# Patient Record
Sex: Female | Born: 1990 | Race: Black or African American | Hispanic: No | Marital: Single | Smoking: Never smoker
Health system: Southern US, Community
[De-identification: ages and names within clinical notes are randomized; demographics above are authoritative.]

## PROBLEM LIST (undated history)

## (undated) DIAGNOSIS — J45909 Unspecified asthma, uncomplicated: Secondary | ICD-10-CM

## (undated) HISTORY — PX: NO PAST SURGERIES: SHX2092

---

## 2017-02-08 ENCOUNTER — Ambulatory Visit (HOSPITAL_COMMUNITY)
Admission: RE | Admit: 2017-02-08 | Discharge: 2017-02-08 | Disposition: A | Discharge status: Home / Self Care | Payer: Self-pay | Source: Ambulatory Visit | Attending: Physician Assistant | Admitting: Physician Assistant

## 2017-02-08 ENCOUNTER — Ambulatory Visit (INDEPENDENT_AMBULATORY_CARE_PROVIDER_SITE_OTHER): Payer: Self-pay | Admitting: Physician Assistant

## 2017-02-08 ENCOUNTER — Encounter (INDEPENDENT_AMBULATORY_CARE_PROVIDER_SITE_OTHER): Payer: Self-pay | Admitting: Physician Assistant

## 2017-02-08 VITALS — BP 118/79 | HR 102 | Temp 97.9°F | Ht 66.0 in | Wt 253.0 lb

## 2017-02-08 DIAGNOSIS — M25571 Pain in right ankle and joints of right foot: Secondary | ICD-10-CM | POA: Insufficient documentation

## 2017-02-08 DIAGNOSIS — S82851A Displaced trimalleolar fracture of right lower leg, initial encounter for closed fracture: Secondary | ICD-10-CM | POA: Insufficient documentation

## 2017-02-08 DIAGNOSIS — X58XXXA Exposure to other specified factors, initial encounter: Secondary | ICD-10-CM | POA: Insufficient documentation

## 2017-02-08 MED ORDER — NAPROXEN 500 MG PO TABS: 500.0000 mg | ORAL_TABLET | Freq: Two times a day (BID) | ORAL | 0 refills | Status: DC

## 2017-02-08 NOTE — Progress Notes (Signed)
  Subjective:  Patient ID: Laurie Cruz, female    DOB: August 15, 1990  Age: 26 y.o. MRN: [id number]  CC: ankle pain  HPI Laurie AlkenLakeisha M Cruz is a 26 y.o. female presents as a new patient. Complains of right ankle pain since 01/05/17 after a fall in [place]. Initially unable to bear weight and could feel the "bone shifting". [place] [facility] [place] [facility] [address], phone [phone], fax [phone]) and was told she had an ankle fracture. Had been wrapped with an ACE bandage and told to go to an orthopedist. Pain worse with ambulation and weight bearing. Has only been able to ambulate since last week. Has been using an OTC ankle splint which helps. Reports that ankle looks "curved" in comparison to the left ankle. Does not endorse limited aROM, paresthesias, or pain at rest. Does not endorse any other symptoms or complaints.    ROS Review of Systems  Constitutional: Negative for chills, fever and malaise/fatigue.  Eyes: Negative for blurred vision.  Respiratory: Negative for shortness of breath.   Cardiovascular: Negative for chest pain and palpitations.  Gastrointestinal: Negative for abdominal pain and nausea.  Genitourinary: Negative for dysuria and hematuria.  Musculoskeletal: Positive for joint pain. Negative for myalgias.  Skin: Negative for rash.  Neurological: Negative for tingling and headaches.  Psychiatric/Behavioral: Negative for depression. The patient is not nervous/anxious.     Objective:  BP 118/79 (BP Location: Left Arm, Patient Position: Sitting, Cuff Size: Large)   Pulse (!) 102   Temp 97.9 F (36.6 C) (Oral)   Ht 5\' 6"  (1.676 m)   Wt 253 lb (114.8 kg)   SpO2 97%   BMI 40.84 kg/m   BP/Weight 02/08/2017  Systolic BP 118  Diastolic BP 79  Wt. (Lbs) 253  BMI 40.84      Physical Exam  Constitutional: She is oriented to person, place, and time.  Well developed, obese, NAD, polite, using cane for ambulation.  HENT:   Head: Normocephalic and atraumatic.  Eyes: No scleral icterus.  Musculoskeletal:  Right ankle with edema and deformity of the medial aspect of the distal tibia. Edema and mild TTP of the same region. Full aROM of the right foot.   Neurological: She is alert and oriented to person, place, and time.  Limp favoring right side.  Skin: Skin is warm and dry. No rash noted. No erythema. No pallor.  Psychiatric: She has a normal mood and affect. Her behavior is normal. Thought content normal.  Vitals reviewed.    Assessment & Plan:   1. Pain in joint involving right ankle and foot - DG Ankle Complete Right; Future - Begin naproxen (NAPROSYN) 500 MG tablet; Take 1 tablet (500 mg total) by mouth 2 (two) times daily with a meal.  Dispense: 30 tablet; Refill: 0 - Pt refused prescription for CAM walker or other assistive device.  Meds ordered this encounter  Medications  . naproxen (NAPROSYN) 500 MG tablet    Sig: Take 1 tablet (500 mg total) by mouth 2 (two) times daily with a meal.    Dispense:  30 tablet    Refill:  0    Order Specific Question:   Supervising Provider    Answer:   Quentin AngstJEGEDE, OLUGBEMIGA E [[id number]]    Follow-up: Return in about 4 weeks (around 03/08/2017), or if symptoms worsen or fail to improve, for full physical.   Loletta Specteroger David Leetta Hendriks PA

## 2017-02-08 NOTE — Patient Instructions (Signed)
Ankle Pain Many things can cause ankle pain, including an injury to the area and overuse of the ankle.The ankle joint holds your body weight and allows you to move around. Ankle pain can occur on either side or the back of one ankle or both ankles. Ankle pain may be sharp and burning or dull and aching. There may be tenderness, stiffness, redness, or warmth around the ankle. Follow these instructions at home: Activity  Rest your ankle as told by your health care provider. Avoid any activities that cause ankle pain.  Do exercises as told by your health care provider.  Ask your health care provider if you can drive. Using a brace, a bandage, or crutches  If you were given a brace: ? Wear it as told by your health care provider. ? Remove it when you take a bath or a shower. ? Try not to move your ankle very much, but wiggle your toes from time to time. This helps to prevent swelling.  If you were given an elastic bandage: ? Remove it when you take a bath or a shower. ? Try not to move your ankle very much, but wiggle your toes from time to time. This helps to prevent swelling. ? Adjust the bandage to make it more comfortable if it feels too tight. ? Loosen the bandage if you have numbness or tingling in your foot or if your foot turns cold and blue.  If you have crutches, use them as told by your health care provider. Continue to use them until you can walk without feeling pain in your ankle. Managing pain, stiffness, and swelling  Raise (elevate) your ankle above the level of your heart while you are sitting or lying down.  If directed, apply ice to the area: ? Put ice in a plastic bag. ? Place a towel between your skin and the bag. ? Leave the ice on for 20 minutes, 2-3 times per day. General instructions  Keep all follow-up visits as told by your health care provider. This is important.  Record this information that may be helpful for you and your health care provider: ? How  often you have ankle pain. ? Where the pain is located. ? What the pain feels like.  Take over-the-counter and prescription medicines only as told by your health care provider. Contact a health care provider if:  Your pain gets worse.  Your pain is not relieved with medicines.  You have a fever or chills.  You are having more trouble with walking.  You have new symptoms. Get help right away if:  Your foot, leg, toes, or ankle tingles or becomes numb.  Your foot, leg, toes, or ankle becomes swollen.  Your foot, leg, toes, or ankle turns pale or blue. This information is not intended to replace advice given to you by your health care provider. Make sure you discuss any questions you have with your health care provider. Document Released: 12/28/2009 Document Revised: 03/10/2016 Document Reviewed: 02/09/2015 Elsevier Interactive Patient Education  2017 Elsevier Inc.  

## 2017-02-09 ENCOUNTER — Ambulatory Visit (INDEPENDENT_AMBULATORY_CARE_PROVIDER_SITE_OTHER): Payer: Self-pay | Admitting: Physician Assistant

## 2017-02-09 ENCOUNTER — Other Ambulatory Visit (INDEPENDENT_AMBULATORY_CARE_PROVIDER_SITE_OTHER): Payer: Self-pay | Admitting: Physician Assistant

## 2017-02-09 DIAGNOSIS — S82891A Other fracture of right lower leg, initial encounter for closed fracture: Secondary | ICD-10-CM

## 2017-02-16 ENCOUNTER — Ambulatory Visit (INDEPENDENT_AMBULATORY_CARE_PROVIDER_SITE_OTHER): Payer: Self-pay | Admitting: Orthopaedic Surgery

## 2017-02-16 ENCOUNTER — Encounter (INDEPENDENT_AMBULATORY_CARE_PROVIDER_SITE_OTHER): Payer: Self-pay | Admitting: Orthopaedic Surgery

## 2017-02-16 DIAGNOSIS — S82851A Displaced trimalleolar fracture of right lower leg, initial encounter for closed fracture: Secondary | ICD-10-CM

## 2017-02-16 HISTORY — DX: Displaced trimalleolar fracture of right lower leg, initial encounter for closed fracture: S82.851A

## 2017-02-16 NOTE — Progress Notes (Addendum)
   Office Visit Note   Patient: Laurie Cruz           Date of Birth: Sep 22, 1990           MRN: [id number] Visit Date: 02/16/2017              Requested by: Loletta SpecterGomez, Roger David, PA-C [address] [place], [place] [id number] PCP: Loletta SpecterGomez, Roger David, PA-C   Assessment & Plan: Visit Diagnoses:  1. Closed displaced trimalleolar fracture of right ankle, initial encounter     Plan: patient has subacute displaced trimal with evidence of callus formation.  She has a complex problem with a long recovery.  Will refer to Dr. Carola FrostHandy for his expertise in complex orthopedic trauma.  Patient in agreement.  Out of work indefinitely.  Follow-Up Instructions: Return if symptoms worsen or fail to improve.   Orders:  Orders Placed This Encounter  Procedures  . Ambulatory referral to Orthopedic Surgery   No orders of the defined types were placed in this encounter.     Procedures: No procedures performed   Clinical Data: No additional findings.   Subjective: Chief Complaint  Patient presents with  . Right Ankle - Pain    Laurie SellLakeisha is a 26 yo healthy female who initially fractured her right ankle 6 weeks ago in [place] and was evaluated by an urgent care and diagnosed as a sprain.  She states that xrays were taken at that time.  [place] returned back to [place] some time after and continued to have pain and instability with her foot giving way.  She presented to her PCP about 1 week ago and xrays showed a subacute displaced trimal ankle fracture.  She was referred to us.  She denies any n/t.    Review of Systems  Constitutional: Negative.   HENT: Negative.   Eyes: Negative.   Respiratory: Negative.   Cardiovascular: Negative.   Endocrine: Negative.   Musculoskeletal: Negative.   Neurological: Negative.   Hematological: Negative.   Psychiatric/Behavioral: Negative.   All other systems reviewed and are negative.    Objective: Vital Signs: There were no vitals taken for this  visit.  Physical Exam  Constitutional: She is oriented to person, place, and time. She appears well-developed and well-nourished.  HENT:  Head: Normocephalic and atraumatic.  Eyes: EOM are normal.  Neck: Neck supple.  Pulmonary/Chest: Effort normal.  Abdominal: Soft.  Neurological: She is alert and oriented to person, place, and time.  Skin: Skin is warm. Capillary refill takes less than 2 seconds.  Psychiatric: She has a normal mood and affect. Her behavior is normal. Judgment and thought content normal.  Nursing note and vitals reviewed.   Ortho Exam Right ankle - no gross deformity of ankle - foot wwp - NVI  Specialty Comments:  No specialty comments available.  Imaging: No results found.   PMFS History: Patient Active Problem List   Diagnosis Date Noted  . Closed displaced trimalleolar fracture of right lower leg 02/16/2017   No past medical history on file.  No family history on file.  No past surgical history on file. Social History   Occupational History  . Not on file.   Social History Main Topics  . Smoking status: Never Smoker  . Smokeless tobacco: Never Used  . Alcohol use Not on file  . Drug use: Unknown  . Sexual activity: Not on file

## 2017-02-19 ENCOUNTER — Telehealth (INDEPENDENT_AMBULATORY_CARE_PROVIDER_SITE_OTHER): Payer: Self-pay | Admitting: Orthopaedic Surgery

## 2017-02-19 NOTE — Telephone Encounter (Signed)
P says someone tried to call her. She says Roda ShuttersXu was referring him to someone that can handle older fx. Per info on referral it states ref to Dr Carola FrostHandy ASAP  896 week old R Trimall ankle fx.  Please call [phone].

## 2017-02-20 ENCOUNTER — Ambulatory Visit: Payer: Self-pay | Attending: Physician Assistant

## 2017-02-20 NOTE — Telephone Encounter (Signed)
No I have not called her yet..still working on referral

## 2017-02-20 NOTE — Telephone Encounter (Signed)
Did you call her?

## 2017-02-22 ENCOUNTER — Encounter (HOSPITAL_COMMUNITY): Payer: Self-pay | Admitting: *Deleted

## 2017-02-22 ENCOUNTER — Telehealth (INDEPENDENT_AMBULATORY_CARE_PROVIDER_SITE_OTHER): Payer: Self-pay | Admitting: Orthopaedic Surgery

## 2017-02-22 NOTE — Progress Notes (Signed)
Spoke with pt for pre-op call. Pt denies cardiac history, chest pain or sob. Pt states she is not diabetic.  

## 2017-02-22 NOTE — Telephone Encounter (Signed)
See message below  Please advise

## 2017-02-22 NOTE — Telephone Encounter (Signed)
Pt stated she was referred out but asked if she ca be referred to a Dr[facility] please.  [phone]

## 2017-02-23 ENCOUNTER — Encounter (HOSPITAL_COMMUNITY)
Admission: RE | Disposition: A | Discharge status: Home / Self Care | Payer: Self-pay | Source: Ambulatory Visit | Attending: Orthopedic Surgery

## 2017-02-23 ENCOUNTER — Inpatient Hospital Stay (HOSPITAL_COMMUNITY): Payer: Medicaid Other

## 2017-02-23 ENCOUNTER — Ambulatory Visit (HOSPITAL_COMMUNITY): Payer: Medicaid Other

## 2017-02-23 ENCOUNTER — Ambulatory Visit (HOSPITAL_COMMUNITY)
Admission: RE | Admit: 2017-02-23 | Discharge: 2017-02-24 | Disposition: A | Discharge status: Home / Self Care | Payer: Medicaid Other | Source: Ambulatory Visit | Attending: Orthopedic Surgery | Admitting: Orthopedic Surgery

## 2017-02-23 ENCOUNTER — Inpatient Hospital Stay (HOSPITAL_COMMUNITY): Payer: Medicaid Other | Admitting: Anesthesiology

## 2017-02-23 ENCOUNTER — Encounter (HOSPITAL_COMMUNITY): Payer: Self-pay

## 2017-02-23 DIAGNOSIS — S82891P Other fracture of right lower leg, subsequent encounter for closed fracture with malunion: Secondary | ICD-10-CM | POA: Diagnosis present

## 2017-02-23 DIAGNOSIS — X58XXXD Exposure to other specified factors, subsequent encounter: Secondary | ICD-10-CM | POA: Insufficient documentation

## 2017-02-23 DIAGNOSIS — M24471 Recurrent dislocation, right ankle: Secondary | ICD-10-CM | POA: Diagnosis not present

## 2017-02-23 DIAGNOSIS — J45909 Unspecified asthma, uncomplicated: Secondary | ICD-10-CM | POA: Diagnosis present

## 2017-02-23 DIAGNOSIS — S82851A Displaced trimalleolar fracture of right lower leg, initial encounter for closed fracture: Secondary | ICD-10-CM | POA: Diagnosis present

## 2017-02-23 DIAGNOSIS — Z419 Encounter for procedure for purposes other than remedying health state, unspecified: Secondary | ICD-10-CM

## 2017-02-23 DIAGNOSIS — R2689 Other abnormalities of gait and mobility: Secondary | ICD-10-CM | POA: Insufficient documentation

## 2017-02-23 DIAGNOSIS — R2681 Unsteadiness on feet: Secondary | ICD-10-CM | POA: Insufficient documentation

## 2017-02-23 DIAGNOSIS — S82851K Displaced trimalleolar fracture of right lower leg, subsequent encounter for closed fracture with nonunion: Principal | ICD-10-CM | POA: Insufficient documentation

## 2017-02-23 DIAGNOSIS — T148XXA Other injury of unspecified body region, initial encounter: Secondary | ICD-10-CM

## 2017-02-23 HISTORY — DX: Unspecified asthma, uncomplicated: J45.909

## 2017-02-23 HISTORY — PX: ORIF ANKLE FRACTURE: SHX5408

## 2017-02-23 HISTORY — DX: Other fracture of right lower leg, subsequent encounter for closed fracture with malunion: S82.891P

## 2017-02-23 LAB — COMPREHENSIVE METABOLIC PANEL
ALK PHOS: 84 U/L (ref 38–126)
ALT: 14 U/L (ref 14–54)
ANION GAP: 8 (ref 5–15)
AST: 18 U/L (ref 15–41)
Albumin: 4.1 g/dL (ref 3.5–5.0)
BUN: 11 mg/dL (ref 6–20)
CALCIUM: 9.4 mg/dL (ref 8.9–10.3)
CO2: 23 mmol/L (ref 22–32)
Chloride: 107 mmol/L (ref 101–111)
Creatinine, Ser: 0.83 mg/dL (ref 0.44–1.00)
GFR calc non Af Amer: >60 mL/min (ref >60)
Glucose, Bld: 100 mg/dL — ABNORMAL HIGH (ref 65–99)
Potassium: 3.6 mmol/L (ref 3.5–5.1)
SODIUM: 138 mmol/L (ref 135–145)
TOTAL PROTEIN: 7.8 g/dL (ref 6.5–8.1)
Total Bilirubin: 0.6 mg/dL (ref 0.3–1.2)

## 2017-02-23 LAB — CBC
HEMATOCRIT: 39.2 % (ref 36.0–46.0)
Hemoglobin: 13 g/dL (ref 12.0–15.0)
MCH: 29.9 pg (ref 26.0–34.0)
MCHC: 33.2 g/dL (ref 30.0–36.0)
MCV: 90.1 fL (ref 78.0–100.0)
PLATELETS: 391 10*3/uL (ref 150–400)
RBC: 4.35 MIL/uL (ref 3.87–5.11)
RDW: 12.6 % (ref 11.5–15.5)
WBC: 12.3 10*3/uL — ABNORMAL HIGH (ref 4.0–10.5)

## 2017-02-23 LAB — URINALYSIS, ROUTINE W REFLEX MICROSCOPIC
Bilirubin Urine: NEGATIVE
Glucose, UA: NEGATIVE mg/dL
Hgb urine dipstick: NEGATIVE
Ketones, ur: NEGATIVE mg/dL
LEUKOCYTES UA: NEGATIVE
NITRITE: NEGATIVE
Protein, ur: NEGATIVE mg/dL
SPECIFIC GRAVITY, URINE: 1.018 (ref 1.005–1.030)
pH: 6 (ref 5.0–8.0)

## 2017-02-23 LAB — TYPE AND SCREEN
ABO/RH(D): O POS
ANTIBODY SCREEN: NEGATIVE

## 2017-02-23 LAB — PROTIME-INR
INR: 1.09
Prothrombin Time: 14.2 seconds (ref 11.4–15.2)

## 2017-02-23 LAB — APTT: aPTT: 33 seconds (ref 24–36)

## 2017-02-23 LAB — ABO/RH: ABO/RH(D): O POS

## 2017-02-23 LAB — HCG, SERUM, QUALITATIVE: Preg, Serum: NEGATIVE

## 2017-02-23 SURGERY — OPEN REDUCTION INTERNAL FIXATION (ORIF) ANKLE FRACTURE
Anesthesia: General | Site: Ankle | Laterality: Right

## 2017-02-23 MED ORDER — METHOCARBAMOL 500 MG PO TABS: 500.0000 mg | ORAL_TABLET | Freq: Four times a day (QID) | ORAL | Status: DC | PRN

## 2017-02-23 MED ORDER — FENTANYL CITRATE (PF) 100 MCG/2ML IJ SOLN
100.0000 ug | Freq: Once | INTRAMUSCULAR | Status: AC
  Administered 2017-02-23: 100 ug via INTRAVENOUS

## 2017-02-23 MED ORDER — ACETAMINOPHEN 500 MG PO TABS
1000.0000 mg | ORAL_TABLET | Freq: Once | ORAL | Status: DC
  Filled 2017-02-23: qty 2

## 2017-02-23 MED ORDER — MIDAZOLAM HCL 5 MG/5ML IJ SOLN
INTRAMUSCULAR | Status: DC | PRN
  Administered 2017-02-23: 1 mg via INTRAVENOUS

## 2017-02-23 MED ORDER — ONDANSETRON HCL 4 MG/2ML IJ SOLN: 4.0000 mg | Freq: Four times a day (QID) | INTRAMUSCULAR | Status: DC | PRN

## 2017-02-23 MED ORDER — MIDAZOLAM HCL 2 MG/2ML IJ SOLN
2.0000 mg | Freq: Once | INTRAMUSCULAR | Status: AC
  Administered 2017-02-23: 2 mg via INTRAVENOUS

## 2017-02-23 MED ORDER — CEFAZOLIN SODIUM-DEXTROSE 2-4 GM/100ML-% IV SOLN
2.0000 g | INTRAVENOUS | Status: AC
  Administered 2017-02-23: 2 g via INTRAVENOUS
  Filled 2017-02-23: qty 100

## 2017-02-23 MED ORDER — ENOXAPARIN SODIUM 40 MG/0.4ML ~~LOC~~ SOLN
40.0000 mg | SUBCUTANEOUS | Status: DC
  Administered 2017-02-24: 40 mg via SUBCUTANEOUS
  Filled 2017-02-23: qty 0.4

## 2017-02-23 MED ORDER — FENTANYL CITRATE (PF) 100 MCG/2ML IJ SOLN
INTRAMUSCULAR | Status: AC
  Filled 2017-02-23: qty 2

## 2017-02-23 MED ORDER — ACETAMINOPHEN 650 MG RE SUPP: 650.0000 mg | Freq: Four times a day (QID) | RECTAL | Status: DC | PRN

## 2017-02-23 MED ORDER — DEXAMETHASONE SODIUM PHOSPHATE 10 MG/ML IJ SOLN
INTRAMUSCULAR | Status: AC
  Filled 2017-02-23: qty 1

## 2017-02-23 MED ORDER — DIPHENHYDRAMINE HCL 50 MG/ML IJ SOLN: 12.5000 mg | Freq: Four times a day (QID) | INTRAMUSCULAR | Status: DC | PRN

## 2017-02-23 MED ORDER — POTASSIUM CHLORIDE IN NACL 20-0.9 MEQ/L-% IV SOLN
INTRAVENOUS | Status: DC
  Administered 2017-02-23 – 2017-02-24 (×3): via INTRAVENOUS
  Filled 2017-02-23 (×3): qty 1000

## 2017-02-23 MED ORDER — MAGNESIUM CITRATE PO SOLN: 1.0000 | Freq: Once | ORAL | Status: DC | PRN

## 2017-02-23 MED ORDER — ONDANSETRON HCL 4 MG/2ML IJ SOLN
4.0000 mg | Freq: Four times a day (QID) | INTRAMUSCULAR | Status: DC | PRN
  Administered 2017-02-24: 4 mg via INTRAVENOUS
  Filled 2017-02-23 (×2): qty 2

## 2017-02-23 MED ORDER — HYDROMORPHONE 1 MG/ML IV SOLN
INTRAVENOUS | Status: AC
  Filled 2017-02-23: qty 25

## 2017-02-23 MED ORDER — ONDANSETRON HCL 4 MG/2ML IJ SOLN: 4.0000 mg | Freq: Once | INTRAMUSCULAR | Status: DC | PRN

## 2017-02-23 MED ORDER — HYDROCODONE-ACETAMINOPHEN 7.5-325 MG PO TABS
1.0000 | ORAL_TABLET | ORAL | Status: DC | PRN
  Administered 2017-02-24: 2 via ORAL
  Filled 2017-02-23: qty 2

## 2017-02-23 MED ORDER — LACTATED RINGERS IV SOLN
INTRAVENOUS | Status: DC
  Administered 2017-02-23 (×3): via INTRAVENOUS

## 2017-02-23 MED ORDER — ROCURONIUM BROMIDE 10 MG/ML (PF) SYRINGE
PREFILLED_SYRINGE | INTRAVENOUS | Status: AC
  Filled 2017-02-23: qty 5

## 2017-02-23 MED ORDER — SODIUM CHLORIDE 0.9% FLUSH: 9.0000 mL | INTRAVENOUS | Status: DC | PRN

## 2017-02-23 MED ORDER — FENTANYL CITRATE (PF) 250 MCG/5ML IJ SOLN
INTRAMUSCULAR | Status: AC
  Filled 2017-02-23: qty 5

## 2017-02-23 MED ORDER — DEXAMETHASONE SODIUM PHOSPHATE 10 MG/ML IJ SOLN
INTRAMUSCULAR | Status: DC | PRN
  Administered 2017-02-23: 10 mg via INTRAVENOUS

## 2017-02-23 MED ORDER — FENTANYL CITRATE (PF) 100 MCG/2ML IJ SOLN
INTRAMUSCULAR | Status: DC | PRN
  Administered 2017-02-23 (×2): 50 ug via INTRAVENOUS
  Administered 2017-02-23: 100 ug via INTRAVENOUS
  Administered 2017-02-23: 50 ug via INTRAVENOUS

## 2017-02-23 MED ORDER — 0.9 % SODIUM CHLORIDE (POUR BTL) OPTIME
TOPICAL | Status: DC | PRN
  Administered 2017-02-23: 1000 mL

## 2017-02-23 MED ORDER — POLYETHYLENE GLYCOL 3350 17 G PO PACK
17.0000 g | PACK | Freq: Every day | ORAL | Status: DC
  Administered 2017-02-24: 17 g via ORAL
  Filled 2017-02-23: qty 1

## 2017-02-23 MED ORDER — SUGAMMADEX SODIUM 200 MG/2ML IV SOLN
INTRAVENOUS | Status: AC
  Filled 2017-02-23: qty 2

## 2017-02-23 MED ORDER — ROCURONIUM BROMIDE 100 MG/10ML IV SOLN
INTRAVENOUS | Status: DC | PRN
  Administered 2017-02-23: 10 mg via INTRAVENOUS
  Administered 2017-02-23: 20 mg via INTRAVENOUS
  Administered 2017-02-23: 50 mg via INTRAVENOUS
  Administered 2017-02-23: 10 mg via INTRAVENOUS

## 2017-02-23 MED ORDER — METOCLOPRAMIDE HCL 5 MG PO TABS: 5.0000 mg | ORAL_TABLET | Freq: Three times a day (TID) | ORAL | Status: DC | PRN

## 2017-02-23 MED ORDER — SODIUM CHLORIDE 0.9 % IV SOLN
INTRAVENOUS | Status: DC
  Administered 2017-02-23: 13:00:00 via INTRAVENOUS

## 2017-02-23 MED ORDER — DIPHENHYDRAMINE HCL 12.5 MG/5ML PO ELIX: 12.5000 mg | ORAL_SOLUTION | Freq: Four times a day (QID) | ORAL | Status: DC | PRN

## 2017-02-23 MED ORDER — DOCUSATE SODIUM 100 MG PO CAPS
100.0000 mg | ORAL_CAPSULE | Freq: Two times a day (BID) | ORAL | Status: DC
  Administered 2017-02-23 – 2017-02-24 (×2): 100 mg via ORAL
  Filled 2017-02-23 (×2): qty 1

## 2017-02-23 MED ORDER — ONDANSETRON HCL 4 MG PO TABS: 4.0000 mg | ORAL_TABLET | Freq: Four times a day (QID) | ORAL | Status: DC | PRN

## 2017-02-23 MED ORDER — PROPOFOL 10 MG/ML IV BOLUS
INTRAVENOUS | Status: DC | PRN
  Administered 2017-02-23: 200 mg via INTRAVENOUS
  Administered 2017-02-23: 50 mg via INTRAVENOUS

## 2017-02-23 MED ORDER — FENTANYL CITRATE (PF) 100 MCG/2ML IJ SOLN: 25.0000 ug | INTRAMUSCULAR | Status: DC | PRN

## 2017-02-23 MED ORDER — ONDANSETRON HCL 4 MG/2ML IJ SOLN
INTRAMUSCULAR | Status: AC
  Filled 2017-02-23: qty 2

## 2017-02-23 MED ORDER — SUGAMMADEX SODIUM 200 MG/2ML IV SOLN
INTRAVENOUS | Status: DC | PRN
  Administered 2017-02-23: 200 mg via INTRAVENOUS

## 2017-02-23 MED ORDER — MIDAZOLAM HCL 2 MG/2ML IJ SOLN
INTRAMUSCULAR | Status: AC
  Filled 2017-02-23: qty 2

## 2017-02-23 MED ORDER — OXYCODONE HCL 5 MG PO TABS: 5.0000 mg | ORAL_TABLET | Freq: Once | ORAL | Status: DC | PRN

## 2017-02-23 MED ORDER — PROPOFOL 10 MG/ML IV BOLUS
INTRAVENOUS | Status: AC
  Filled 2017-02-23: qty 40

## 2017-02-23 MED ORDER — CEFAZOLIN SODIUM-DEXTROSE 1-4 GM/50ML-% IV SOLN
1.0000 g | Freq: Four times a day (QID) | INTRAVENOUS | Status: AC
  Administered 2017-02-23 – 2017-02-24 (×3): 1 g via INTRAVENOUS
  Filled 2017-02-23 (×4): qty 50

## 2017-02-23 MED ORDER — METHOCARBAMOL 1000 MG/10ML IJ SOLN
1000.0000 mg | Freq: Four times a day (QID) | INTRAVENOUS | Status: DC | PRN
  Filled 2017-02-23: qty 10

## 2017-02-23 MED ORDER — HYDROMORPHONE 1 MG/ML IV SOLN
INTRAVENOUS | Status: DC
  Administered 2017-02-23: 12:00:00 via INTRAVENOUS
  Administered 2017-02-23: 0.6 mg via INTRAVENOUS
  Administered 2017-02-23: 0.9 mg via INTRAVENOUS
  Administered 2017-02-24: 1.5 mg via INTRAVENOUS

## 2017-02-23 MED ORDER — CHLORHEXIDINE GLUCONATE 4 % EX LIQD: 60.0000 mL | Freq: Once | CUTANEOUS | Status: DC

## 2017-02-23 MED ORDER — OXYCODONE HCL 5 MG/5ML PO SOLN: 5.0000 mg | Freq: Once | ORAL | Status: DC | PRN

## 2017-02-23 MED ORDER — ACETAMINOPHEN 325 MG PO TABS: 650.0000 mg | ORAL_TABLET | Freq: Four times a day (QID) | ORAL | Status: DC | PRN

## 2017-02-23 MED ORDER — BISACODYL 5 MG PO TBEC: 5.0000 mg | DELAYED_RELEASE_TABLET | Freq: Every day | ORAL | Status: DC | PRN

## 2017-02-23 MED ORDER — METOCLOPRAMIDE HCL 5 MG/ML IJ SOLN: 5.0000 mg | Freq: Three times a day (TID) | INTRAMUSCULAR | Status: DC | PRN

## 2017-02-23 MED ORDER — NALOXONE HCL 0.4 MG/ML IJ SOLN: 0.4000 mg | INTRAMUSCULAR | Status: DC | PRN

## 2017-02-23 MED ORDER — ONDANSETRON HCL 4 MG/2ML IJ SOLN
INTRAMUSCULAR | Status: DC | PRN
  Administered 2017-02-23: 4 mg via INTRAVENOUS

## 2017-02-23 SURGICAL SUPPLY — 82 items
BANDAGE ACE 4X5 VEL STRL LF (GAUZE/BANDAGES/DRESSINGS) ×2 IMPLANT
BANDAGE ACE 6X5 VEL STRL LF (GAUZE/BANDAGES/DRESSINGS) ×2 IMPLANT
BANDAGE ESMARK 6X9 LF (GAUZE/BANDAGES/DRESSINGS) ×1 IMPLANT
BIT DRILL 2.5X110 QC LCP DISP (BIT) ×2 IMPLANT
BIT DRILL 2.8 (BIT) ×1
BIT DRILL CANN QC 2.8X165 (BIT) IMPLANT
BNDG ADH 5X4 AIR PERM ELC (GAUZE/BANDAGES/DRESSINGS) ×2
BNDG CMPR 9X6 STRL LF SNTH (GAUZE/BANDAGES/DRESSINGS) ×1
BNDG COHESIVE 4X5 WHT NS (GAUZE/BANDAGES/DRESSINGS) ×4 IMPLANT
BNDG ESMARK 6X9 LF (GAUZE/BANDAGES/DRESSINGS) ×3
BNDG GAUZE ELAST 4 BULKY (GAUZE/BANDAGES/DRESSINGS) ×6 IMPLANT
BRUSH SCRUB SURG 4.25 DISP (MISCELLANEOUS) ×6 IMPLANT
COVER MAYO STAND STRL (DRAPES) ×3 IMPLANT
COVER SURGICAL LIGHT HANDLE (MISCELLANEOUS) ×3 IMPLANT
DRAPE C-ARM 42X72 X-RAY (DRAPES) ×2 IMPLANT
DRAPE C-ARMOR (DRAPES) ×6 IMPLANT
DRAPE HALF SHEET 40X57 (DRAPES) ×3 IMPLANT
DRAPE U-SHAPE 47X51 STRL (DRAPES) ×3 IMPLANT
DRILL BIT 2.8MM (BIT) ×3
DRSG EMULSION OIL 3X3 NADH (GAUZE/BANDAGES/DRESSINGS) ×2 IMPLANT
DRSG MEPITEL 4X7.2 (GAUZE/BANDAGES/DRESSINGS) ×4 IMPLANT
DRSG PAD ABDOMINAL 8X10 ST (GAUZE/BANDAGES/DRESSINGS) ×4 IMPLANT
ELECT REM PT RETURN 9FT ADLT (ELECTROSURGICAL) ×3
ELECTRODE REM PT RTRN 9FT ADLT (ELECTROSURGICAL) ×1 IMPLANT
GAUZE SPONGE 4X4 12PLY STRL (GAUZE/BANDAGES/DRESSINGS) ×2 IMPLANT
GLOVE BIO SURGEON STRL SZ7.5 (GLOVE) ×3 IMPLANT
GLOVE BIO SURGEON STRL SZ8 (GLOVE) ×3 IMPLANT
GLOVE BIOGEL PI IND STRL 7.0 (GLOVE) IMPLANT
GLOVE BIOGEL PI IND STRL 7.5 (GLOVE) ×1 IMPLANT
GLOVE BIOGEL PI IND STRL 8 (GLOVE) ×1 IMPLANT
GLOVE BIOGEL PI INDICATOR 7.0 (GLOVE) ×2
GLOVE BIOGEL PI INDICATOR 7.5 (GLOVE) ×2
GLOVE BIOGEL PI INDICATOR 8 (GLOVE) ×2
GLOVE INDICATOR 7.0 STRL GRN (GLOVE) ×2 IMPLANT
GLOVE SURG SS PI 7.0 STRL IVOR (GLOVE) ×2 IMPLANT
GOWN STRL REUS W/ TWL LRG LVL3 (GOWN DISPOSABLE) ×2 IMPLANT
GOWN STRL REUS W/ TWL XL LVL3 (GOWN DISPOSABLE) ×1 IMPLANT
GOWN STRL REUS W/TWL LRG LVL3 (GOWN DISPOSABLE) ×6
GOWN STRL REUS W/TWL XL LVL3 (GOWN DISPOSABLE) ×3
GUIDEWIRE THREADED 150MM (WIRE) ×8 IMPLANT
KIT 1/3 TUB PL 8H 97M (Orthopedic Implant) IMPLANT
KIT BASIN OR (CUSTOM PROCEDURE TRAY) ×3 IMPLANT
KIT ROOM TURNOVER OR (KITS) ×3 IMPLANT
MANIFOLD NEPTUNE II (INSTRUMENTS) ×3 IMPLANT
NDL HYPO 21X1.5 SAFETY (NEEDLE) IMPLANT
NEEDLE HYPO 21X1.5 SAFETY (NEEDLE) IMPLANT
NS IRRIG 1000ML POUR BTL (IV SOLUTION) ×3 IMPLANT
PACK GENERAL/GYN (CUSTOM PROCEDURE TRAY) ×3 IMPLANT
PACK ORTHO EXTREMITY (CUSTOM PROCEDURE TRAY) ×3 IMPLANT
PAD ARMBOARD 7.5X6 YLW CONV (MISCELLANEOUS) ×6 IMPLANT
PAD CAST 4YDX4 CTTN HI CHSV (CAST SUPPLIES) IMPLANT
PADDING CAST COTTON 4X4 STRL (CAST SUPPLIES) ×3
PADDING CAST COTTON 6X4 STRL (CAST SUPPLIES) ×2 IMPLANT
PROS 1/3 TUB PL 8H 97M (Orthopedic Implant) ×3 IMPLANT
SCREW CANC PT 4.0X40 (Screw) ×3 IMPLANT
SCREW CANC PT 40X14X4X6X (Screw) IMPLANT
SCREW CANN S THRD/36 4.0 (Screw) ×4 IMPLANT
SCREW CORTEX 3.5 16MM (Screw) ×6 IMPLANT
SCREW LOCK CORT ST 3.5X16 (Screw) IMPLANT
SCREW LOCK T15 FT 14X3.5X2.9X (Screw) IMPLANT
SCREW LOCK T15 FT 16X3.5X2.9X (Screw) IMPLANT
SCREW LOCK T15 FT 38X3.5XST (Screw) IMPLANT
SCREW LOCKING 3.5X14 (Screw) ×6 IMPLANT
SCREW LOCKING 3.5X16 (Screw) ×6 IMPLANT
SCREW LOCKING 3.5X38 (Screw) ×3 IMPLANT
SCREW LOCKING 3.5X52MM (Screw) ×2 IMPLANT
SPONGE LAP 18X18 X RAY DECT (DISPOSABLE) ×3 IMPLANT
STAPLER VISISTAT 35W (STAPLE) IMPLANT
SUCTION FRAZIER HANDLE 10FR (MISCELLANEOUS) ×2
SUCTION TUBE FRAZIER 10FR DISP (MISCELLANEOUS) ×1 IMPLANT
SUT ETHILON 3 0 PS 1 (SUTURE) ×6 IMPLANT
SUT PDS AB 2-0 CT1 27 (SUTURE) IMPLANT
SUT VIC AB 1 CT1 27 (SUTURE) ×3
SUT VIC AB 1 CT1 27XBRD ANBCTR (SUTURE) IMPLANT
SUT VIC AB 2-0 CT1 27 (SUTURE) ×6
SUT VIC AB 2-0 CT1 TAPERPNT 27 (SUTURE) ×2 IMPLANT
TOWEL OR 17X24 6PK STRL BLUE (TOWEL DISPOSABLE) ×3 IMPLANT
TOWEL OR 17X26 10 PK STRL BLUE (TOWEL DISPOSABLE) ×6 IMPLANT
TUBE CONNECTING 12'X1/4 (SUCTIONS) ×1
TUBE CONNECTING 12X1/4 (SUCTIONS) ×2 IMPLANT
UNDERPAD 30X30 (UNDERPADS AND DIAPERS) ×3 IMPLANT
WATER STERILE IRR 1000ML POUR (IV SOLUTION) ×3 IMPLANT

## 2017-02-23 NOTE — Anesthesia Procedure Notes (Signed)
Anesthesia Regional Block: Adductor canal block   Pre-Anesthetic Checklist: ,, timeout performed, Correct Patient, Correct Site, Correct Laterality, Correct Procedure, Correct Position, site marked, Risks and benefits discussed,  Surgical consent,  Pre-op evaluation,  At surgeon's request and post-op pain management  Laterality: Left  Prep: chloraprep       Needles:  Injection technique: Single-shot  Needle Type: Echogenic Stimulator Needle      Needle Gauge: 21     Additional Needles:   Procedures: ultrasound guided,,,,,,,,  Narrative:  Start time: 02/23/2017 7:15 AM End time: 02/23/2017 7:20 AM Injection made incrementally with aspirations every 5 mL.  Performed by: Personally   Additional Notes: 15 cc 0.5% Bupivacaine with 1:200 epi injected easily

## 2017-02-23 NOTE — Brief Op Note (Signed)
02/23/2017  12:03 PM  PATIENT:  Laurie Cruz  26 y.o. female  PRE-OPERATIVE DIAGNOSIS:   1. RIGHT TRIMALLEOLAR  FRACTURE MALUNION 2. RIGHT ANKLE CHRONIC DISLOCATION  POST-OPERATIVE DIAGNOSIS:   1. RIGHT TRIMALLEOLAR  FRACTURE MALUNION 2. RIGHT ANKLE CHRONIC DISLOCATION  PROCEDURE:  Procedure(s): 1. RIGHT FIBULAR OSTEOTOMY 2. OPEN REDUCTION INTERNAL FIXATION (ORIF) RIGHT ANKLE DISLOCATION 3. OPEN REDUCTION INTERNAL FIXATION (ORIF) RIGHT TRIMALLEOLAR  FRACTURE (Right) WITHOUT FIXATION OF POSTERIOR LIP  SURGEON:  Surgeon(s) and Role:    Myrene Galas* Aren Pryde, MD - Primary  PHYSICIAN ASSISTANT: Montez MoritaKEITH PAUL, PA-C  ANESTHESIA:   general and regional  EBL:  Total I/O In: 1500 [I.V.:1500] Out: 150 [Blood:150]  BLOOD ADMINISTERED:none  DRAINS: none   LOCAL MEDICATIONS USED:  NONE  SPECIMEN:  No Specimen  DISPOSITION OF SPECIMEN:  N/A  COUNTS:  YES  TOURNIQUET:    DICTATION: .Other Dictation: Dictation Number [id number]  PLAN OF CARE: Admit to inpatient   PATIENT DISPOSITION:  PACU - hemodynamically stable.   Delay start of Pharmacological VTE agent (>24hrs) due to surgical blood loss or risk of bleeding: no

## 2017-02-23 NOTE — Anesthesia Preprocedure Evaluation (Addendum)
Anesthesia Evaluation  Patient identified by MRN, date of birth, ID band Patient awake    Reviewed: Allergy & Precautions, NPO status , Patient's Chart, lab work & pertinent test results  Airway Mallampati: II       Dental  (+) Teeth Intact, Dental Advisory Given   Pulmonary    breath sounds clear to auscultation       Cardiovascular  Rhythm:Regular Rate:Normal     Neuro/Psych    GI/Hepatic   Endo/Other    Renal/GU      Musculoskeletal   Abdominal (+) + obese,   Peds  Hematology   Anesthesia Other Findings   Reproductive/Obstetrics                             Anesthesia Physical Anesthesia Plan  ASA: II  Anesthesia Plan: General   Post-op Pain Management:  Regional for Post-op pain   Induction: Intravenous  PONV Risk Score and Plan: Ondansetron and Dexamethasone  Airway Management Planned: Oral ETT  Additional Equipment:   Intra-op Plan:   Post-operative Plan:   Informed Consent: I have reviewed the patients History and Physical, chart, labs and discussed the procedure including the risks, benefits and alternatives for the proposed anesthesia with the patient or authorized representative who has indicated his/her understanding and acceptance.   Dental advisory given  Plan Discussed with: Anesthesiologist and CRNA  Anesthesia Plan Comments:         Anesthesia Quick Evaluation

## 2017-02-23 NOTE — Evaluation (Signed)
Physical Therapy Evaluation Patient Details Name: Laurie Cruz MRN: [id number] DOB: [birth year] Today's Date: 02/23/2017   History of Present Illness  Pt admit for RIGHT FIBULAR OSTEOTOMY,OPEN REDUCTION INTERNAL FIXATION (ORIF) RIGHT ANKLE DISLOCATION,OPEN REDUCTION INTERNAL FIXATION (ORIF) RIGHT TRIMALLEOLAR  FRACTURE (Right) WITHOUT FIXATION OF POSTERIOR LIP.    Past Medical History:  Diagnosis Date  . Asthma    as a child    Clinical Impression  Pt admitted with above diagnosis. Pt currently with functional limitations due to the deficits listed below (see PT Problem List). Pt able to achieve hop to pattern with min assist with RW limited by incr HR up to high 150's.  Will follow acutely to address increasing gait distance and stair training with pt to incr safety.  Pt will benefit from skilled PT to increase their independence and safety with mobility to allow discharge to the venue listed below.      Follow Up Recommendations No PT follow up;Supervision/Assistance - 24 hour    Equipment Recommendations  Other (comment) (may need RW but most likely will progress back to crutches)    Recommendations for Other Services       Precautions / Restrictions Precautions Precautions: Fall Required Braces or Orthoses: Other Brace/Splint Other Brace/Splint: RLE temporary cast.  Restrictions Weight Bearing Restrictions: Yes RLE Weight Bearing: Non weight bearing      Mobility  Bed Mobility Overal bed mobility: Needs Assistance Bed Mobility: Supine to Sit     Supine to sit: Modified independent (Device/Increase time);Supervision        Transfers Overall transfer level: Needs assistance Equipment used: Rolling walker (2 wheeled) Transfers: Sit to/from Stand Sit to Stand: Min guard         General transfer comment: Pt min guard for stability and min cues for hand placement for safety.  Ambulation/Gait Ambulation/Gait assistance: Min guard (+2 for follow  chair.) Ambulation Distance (Feet): 25 Feet Assistive device: Rolling walker (2 wheeled) Gait Pattern/deviations: Step-to pattern (Hop-to pattern. ) Gait velocity: Decreased   General Gait Details: Pt able to perform hop-to pattern well with min cueing for walker/gait sequence. Pt required seated rest break after 25' in hallway due to feeling very warm.   Stairs            Wheelchair Mobility    Modified Rankin (Stroke Patients Only)       Balance Overall balance assessment: Needs assistance Sitting-balance support: No upper extremity supported;Feet supported       Standing balance support: Bilateral upper extremity supported;During functional activity Standing balance-Leahy Scale: Poor Standing balance comment: Due to WB status pt requires BUE support for stability.l                             Pertinent Vitals/Pain Pain Assessment: No/denies pain    Home Living Family/patient expects to be discharged to:: Private residence Living Arrangements: Spouse/significant other Available Help at Discharge: Family;Available 24 hours/day Type of Home: House Home Access: Stairs to enter Entrance Stairs-Rails: None Entrance Stairs-Number of Steps: 3 Home Layout: One level Home Equipment: Cane - single point;Crutches      Prior Function Level of Independence: Independent with assistive device(s)         Comments: used crutches for last 2 weeks     Hand Dominance        Extremity/Trunk Assessment   Upper Extremity Assessment Upper Extremity Assessment: Defer to OT evaluation    Lower Extremity Assessment Lower Extremity  Assessment: RLE deficits/detail RLE Deficits / Details: cast in place,  hip and knee grossly 4-/5 RLE: Unable to fully assess due to pain;Unable to fully assess due to immobilization    Cervical / Trunk Assessment Cervical / Trunk Assessment: Normal  Communication   Communication: No difficulties  Cognition Arousal/Alertness:  Awake/alert Behavior During Therapy: WFL for tasks assessed/performed Overall Cognitive Status: Within Functional Limits for tasks assessed                                 General Comments: Pt was pleasant during tx and motivated to get better.       General Comments General comments (skin integrity, edema, etc.): Family present in room during tx.     Exercises General Exercises - Lower Extremity Ankle Circles/Pumps: AROM;Left;10 reps   Assessment/Plan    PT Assessment Patient needs continued PT services  PT Problem List Decreased strength;Decreased activity tolerance;Decreased balance;Decreased range of motion;Decreased mobility;Decreased knowledge of use of DME;Decreased safety awareness;Decreased knowledge of precautions;Pain       PT Treatment Interventions DME instruction;Gait training;Functional mobility training;Therapeutic activities;Therapeutic exercise;Balance training;Patient/family education;Stair training;Wheelchair mobility training    PT Goals (Current goals can be found in the Care Plan section)  Acute Rehab PT Goals Patient Stated Goal: To go home.  PT Goal Formulation: With patient Time For Goal Achievement: 03/09/17 Potential to Achieve Goals: Good    Frequency Min 6X/week   Barriers to discharge        Co-evaluation               AM-PAC PT "6 Clicks" Daily Activity  Outcome Measure Difficulty turning over in bed (including adjusting bedclothes, sheets and blankets)?: A Little Difficulty moving from lying on back to sitting on the side of the bed? : A Lot Difficulty sitting down on and standing up from a chair with arms (e.g., wheelchair, bedside commode, etc,.)?: A Lot Help needed moving to and from a bed to chair (including a wheelchair)?: A Lot Help needed walking in hospital room?: A Lot Help needed climbing 3-5 steps with a railing? : Total 6 Click Score: 12    End of Session Equipment Utilized During Treatment: Gait  belt Activity Tolerance: Patient tolerated treatment well;Patient limited by fatigue Patient left: in chair;with call bell/phone within reach;with chair alarm set;with nursing/sitter in room;with family/visitor present Nurse Communication: Mobility status PT Visit Diagnosis: Unsteadiness on feet (R26.81);Muscle weakness (generalized) (M62.81);Pain Pain - Right/Left: Right Pain - part of body: Ankle and joints of foot    Time: 1540-1605 PT Time Calculation (min) (ACUTE ONLY): 25 min   Charges:   PT Evaluation $PT Eval Moderate Complexity: 1 Mod PT Treatments $Gait Training: 8-22 mins   PT G Codes:   PT G-Codes **NOT FOR INPATIENT CLASS** Functional Assessment Tool Used: AM-PAC 6 Clicks Basic Mobility Functional Limitation: Mobility: Walking and moving around Mobility: Walking and Moving Around Current Status (N6295(G8978): At least 1 percent but less than 20 percent impaired, limited or restricted Mobility: Walking and Moving Around Goal Status [phone](G8979): At least 1 percent but less than 20 percent impaired, limited or restricted    [facility] Casia Corti,PT Acute Rehabilitation [phone] (pager)   Berline Lopesawn F Greene Diodato 02/23/2017, 4:41 PM

## 2017-02-23 NOTE — Anesthesia Procedure Notes (Signed)
Anesthesia Regional Block: Popliteal block   Pre-Anesthetic Checklist: ,, timeout performed, Correct Patient, Correct Site, Correct Laterality, Correct Procedure, Correct Position, site marked, Risks and benefits discussed,  Surgical consent,  Pre-op evaluation,  At surgeon's request and post-op pain management  Laterality: Right  Prep: chloraprep       Needles:  Injection technique: Single-shot  Needle Type: Echogenic Stimulator Needle      Needle Gauge: 21     Additional Needles:   Procedures: ultrasound guided,,,,,,,,  Narrative:  Start time: 02/23/2017 7:05 AM End time: 02/23/2017 7:10 AM Injection made incrementally with aspirations every 5 mL.  Performed by: Personally   Additional Notes: 25 cc 0.5% Bupivacaine with 1:200 epi injected easily

## 2017-02-23 NOTE — Op Note (Signed)
NAME:  Laurie Cruz, Laurie Cruz                  ACCOUNT NO.:  MEDICAL RECORD NO.:  [id number]  LOCATION:                                 FACILITY:  PHYSICIAN:  Doralee AlbinoMichael H. Carola FrostHandy, M.D.      DATE OF BIRTH:  DATE OF PROCEDURE:  02/23/2017 DATE OF DISCHARGE:                              OPERATIVE REPORT   PREOPERATIVE DIAGNOSES: 1. Right trimalleolar fracture malunion. 2. Right ankle chronic dislocation.  POSTOPERATIVE DIAGNOSES: 1. Right trimalleolar fracture malunion. 2. Right ankle chronic dislocation.  PROCEDURE: 1. Right fibular osteotomy. 2. Open reduction of right ankle chronic dislocation. 3. Open reduction and internal fixation of right trimalleolar ankle     fracture without fixation of the posterior lip.  SURGEON:  Doralee AlbinoMichael H. Carola FrostHandy, M.D.  ASSISTANT:  Montez MoritaKeith Paul, PA-C.  ANESTHESIA:  Regional and general.  COMPLICATIONS:  None.  TOURNIQUET:  None.  I/O:  1500 mL crystalloid, EBL 150 mL.  DISPOSITION:  PACU.  CONDITION:  Stable.  BRIEF SUMMARY OF INDICATIONS FOR PROCEDURE:  Laurie Cruz is a very pleasant 26 year old female, who sustained a trimalleolar fracture of her ankle 6 weeks ago.  She was [place] that she did not have a fracture, but noticed increasing deformity and continued pain, inability to bear weight.  She was subsequently referred to me because of the difficult nature of her injury and its chronicity. I did discuss with her the risks and benefits of attempted repair including the possibility that she may have permanent or subsequent posttraumatic arthritis as a result of her prolonged dislocation that there was potential for nerve injury and vessel injury with the anticipated efforts to restore length as well as the surgical approach, loss of motion, DVT, PE, malunion, nonunion.  After full discussion of these risks and others, she did wish to proceed.  BRIEF SUMMARY OF PROCEDURE:  The patient was given a regional  block preoperatively, taken to the operating room, where general anesthesia was induced.  She did receive preoperative antibiotics.  Her right lower extremity was prepped and draped in usual sterile fashion.  A tourniquet was placed above the thigh, but never inflated during the procedure.  I began with a curvilinear approach over the medial malleolus.  Dissection was carried down to the anteromedial corner and then into the dislocated ankle joint.  Here, the large layer of intra-articular fibrous tissue was excised sharply.  The joint explored.  There were some areas of cartilage loss on the talar dome.  These were not completely eburnated, but certainly showed signs of trauma and wear.  I did evaluate the posterior malleolus, but it was a small shell of bone and would not require additional fixation or open treatment.  After cleaning out the ankle joint through this arthrotomy, I turned my attention laterally.  Here, a long extensile approach was made to the distal fibula leaving enough room to place a screw proximal to my anticipated plate.  I was hopeful that the fracture had not united, but it had.  I was able to identify the old fracture line with a #15 blade and confirmed this with x-ray.  I then took  an osteotome and broke up the fracture site before an osteotomy down to the level of the articular surface, which was confirmed with x-ray.  There was slight posterolateral displacement of the distal fragment and rather severe shortening.  While protecting as much blood supply as possible, I did mobilize these segments and my assistant pulled traction on the ankle.  This was done provisionally.  I then took a locking 1/3 tubular plate placing 2 lock screws in the distal lateral malleolus and placed a screw within the fibula proximally.  I then used a lamina spreader to distract directly against the plate and the screw.  I used a clamp to help maintain translation of the plate and to  better control it.  Given the force applied, the screw bent proximally and the plate was under bending forces throughout, but we were able to maintain this and restore significant amount of length, though not in its entirety.  Any additional force on the ankle had no effect and consequently the laminar spreader into the calcaneus was not applied at that time.  I then secured screws eccentrically in the plate proximally to gain yet a little bit more length and used a clamp distally to internally rotate the distal fragment and close down the fracture gap.  Ultimately, this produced a slightly shortened, but otherwise what appeared to be anatomically aligned fibula with regard to translation rotation.  A standard proximal lock at the apex of the hole was used and then the rest were locking.  An open reduction was then performed of the medial malleolus piece using 2 cannulated screws with standard technique.  My assistant and I then performed a reduction of the tibiotalar ankle joint translating it into an appropriate position medially and placing __________ pins across the tibiotalar joint to maintain its position.  Final AP, lateral, and mortise images showed appropriate reduction of the trimalleolar fracture as well as the tibiotalar joint.  Sterile gently compressive dressing was applied. Standard layered closure performed with 0 Vicryl, 2-0 Vicryl, and 3-0 nylon for the skin.  Montez MoritaKeith Paul, PA-C, assisted me throughout. Assistant was absolutely necessary given the complexity and technical difficulty with reduction.  PROGNOSIS:  Laurie Cruz will be nonweightbearing for the next 6 weeks with likely protected weightbearing for 2 weeks thereafter.  Anticipate removing the pins at 4 weeks and beginning range of motion.  She will be on formal DVT prophylaxis with Lovenox and discharged with aspirin.     Doralee AlbinoMichael H. Carola FrostHandy, M.D.     MHH/MEDQ  D:  02/23/2017  T:  02/23/2017  Job:  [id number]

## 2017-02-23 NOTE — Transfer of Care (Signed)
Immediate Anesthesia Transfer of Care Note  Patient: Laurie Cruz  Procedure(s) Performed: Procedure(s): OPEN REDUCTION INTERNAL FIXATION (ORIF) RIGHT TRIMALLEOLAR  FRACTURE (Right)  Patient Location: PACU  Anesthesia Type:General and Regional  Level of Consciousness: awake, oriented and patient cooperative  Airway & Oxygen Therapy: Patient Spontanous Breathing and Patient connected to nasal cannula oxygen  Post-op Assessment: Report given to RN and Post -op Vital signs reviewed and stable  Post vital signs: Reviewed and stable  Last Vitals:  Vitals:   02/23/17 0622 02/23/17 1200  BP: 113/74   Pulse: (!) 102 (!) (P) 101  Resp: 18 (P) 18  Temp: 36.8 C (P) 36.6 C    Last Pain:  Vitals:   02/23/17 0622  TempSrc: Oral      Patients Stated Pain Goal: 3 (02/23/17 [id number])  Complications: No apparent anesthesia complications

## 2017-02-23 NOTE — Anesthesia Postprocedure Evaluation (Signed)
Anesthesia Post Note  Patient: Laurie Cruz  Procedure(s) Performed: Procedure(s) (LRB): OPEN REDUCTION INTERNAL FIXATION (ORIF) RIGHT TRIMALLEOLAR  FRACTURE (Right)     Patient location during evaluation: PACU Anesthesia Type: General Level of consciousness: awake and alert Pain management: pain level controlled Vital Signs Assessment: post-procedure vital signs reviewed and stable Respiratory status: spontaneous breathing, nonlabored ventilation, respiratory function stable and patient connected to nasal cannula oxygen Cardiovascular status: blood pressure returned to baseline and stable Postop Assessment: no signs of nausea or vomiting Anesthetic complications: no    Last Vitals:  Vitals:   02/23/17 1300 02/23/17 1317  BP: 114/76 107/74  Pulse: 86 83  Resp: 17 16  Temp: (!) 36.4 C 36.8 C    Last Pain:  Vitals:   02/23/17 1317  TempSrc: Oral  PainSc:                  Kennieth RadFitzgerald, Chandelle Harkey E

## 2017-02-23 NOTE — H&P (Signed)
[facility] H&P/Consult     Chief Complaint: right ankle deformity HPI: Laurie Cruz is an 26 y.o. female.s/p fall with increasing deformity. Told no fracture at first but over 6 wks has continued to drift.  Past Medical History:  Diagnosis Date  . Asthma    as a child    Past Surgical History:  Procedure Laterality Date  . NO PAST SURGERIES      Family History  Problem Relation Age of Onset  . Anemia Mother   . Diabetes Father    Social History:  reports that she has never smoked. She has never used smokeless tobacco. She reports that she drinks alcohol. She reports that she uses drugs, including Marijuana.  Allergies: No Known Allergies  Medications Prior to Admission  Medication Sig Dispense Refill  . naproxen (NAPROSYN) 500 MG tablet Take 1 tablet (500 mg total) by mouth 2 (two) times daily with a meal. (Patient not taking: Reported on 02/22/2017) 30 tablet 0    Results for orders placed or performed during the hospital encounter of 02/23/17 (from the past 48 hour(s))  CBC     Status: Abnormal   Collection Time: 02/23/17  6:35 AM  Result Value Ref Range   WBC 12.3 (H) 4.0 - 10.5 K/uL   RBC 4.35 3.87 - 5.11 MIL/uL   Hemoglobin 13.0 12.0 - 15.0 g/dL   HCT 39.2 36.0 - 46.0 %   MCV 90.1 78.0 - 100.0 fL   MCH 29.9 26.0 - 34.0 pg   MCHC 33.2 30.0 - 36.0 g/dL   RDW 12.6 11.5 - 15.5 %   Platelets 391 150 - 400 K/uL  hCG, serum, qualitative     Status: None   Collection Time: 02/23/17  6:35 AM  Result Value Ref Range   Preg, Serum NEGATIVE NEGATIVE    Comment:        THE SENSITIVITY OF THIS METHODOLOGY IS >10 mIU/mL.   Comprehensive metabolic panel     Status: Abnormal   Collection Time: 02/23/17  6:35 AM  Result Value Ref Range   Sodium 138 135 - 145 mmol/L   Potassium 3.6 3.5 - 5.1 mmol/L   Chloride 107 101 - 111 mmol/L   CO2 23 22 - 32 mmol/L   Glucose, Bld 100 (H) 65 - 99 mg/dL   BUN 11 6 - 20 mg/dL   Creatinine, Ser 0.83 0.44 - 1.00  mg/dL   Calcium 9.4 8.9 - 10.3 mg/dL   Total Protein 7.8 6.5 - 8.1 g/dL   Albumin 4.1 3.5 - 5.0 g/dL   AST 18 15 - 41 U/L   ALT 14 14 - 54 U/L   Alkaline Phosphatase 84 38 - 126 U/L   Total Bilirubin 0.6 0.3 - 1.2 mg/dL   GFR calc non Af Amer >60 >60 mL/min   GFR calc Af Amer >60 >60 mL/min    Comment: (NOTE) The eGFR has been calculated using the CKD EPI equation. This calculation has not been validated in all clinical situations. eGFR's persistently <60 mL/min signify possible Chronic Kidney Disease.    Anion gap 8 5 - 15  Protime-INR     Status: None   Collection Time: 02/23/17  6:35 AM  Result Value Ref Range   Prothrombin Time 14.2 11.4 - 15.2 seconds   INR 1.09   APTT     Status: None   Collection Time: 02/23/17  6:35 AM  Result Value Ref Range   aPTT 33 24 - 36  seconds  Type and screen Order type and screen if day of surgery is less than 15 days from draw of preadmission visit or order morning of surgery if day of surgery is greater than 6 days from preadmission visit.     Status: None   Collection Time: 02/23/17  6:38 AM  Result Value Ref Range   ABO/RH(D) O POS    Antibody Screen NEG    Sample Expiration 02/26/2017    No results found.  ROS No recent fever, bleeding abnormalities, urologic dysfunction, GI problems, or weight gain.  Blood pressure 113/74, pulse (!) 102, temperature 98.2 F (36.8 C), temperature source Oral, resp. rate 18, height '5\' 6"'  (1.676 m), weight 114.8 kg (253 lb), SpO2 100 %. Physical Exam RRR CTA S/NT/ND RLE No traumatic wounds, ecchymosis, or rash  Tender, deofrmied  No effusions  Sens DPN, SPN, TN intact  Motor EHL, ext, flex, evers 5/5  DP 2+, PT 2+, No significant edema    Assessment/Plan Right ankle deformity for malunion repair  I discussed with the patient the risks and benefits of surgery, including the possibility of infection, nerve injury, vessel injury, wound breakdown, arthritis, symptomatic hardware, DVT/ PE, loss of  motion, and need for further surgery among others.  We also specifically discussed the need to stage surgery because of the elevated risk of soft tissue breakdown that could lead to amputation.  He understood these risks and wished to proceed.   [facility], MD [facility] [phone] (p)  02/23/2017, 8:05 AM

## 2017-02-23 NOTE — Anesthesia Procedure Notes (Signed)
Procedure Name: Intubation Date/Time: 02/23/2017 8:22 AM Performed by: Fransisca KaufmannMEYER, Lemario Chaikin E Pre-anesthesia Checklist: Patient identified, Emergency Drugs available, Suction available and Patient being monitored Patient Re-evaluated:Patient Re-evaluated prior to induction Oxygen Delivery Method: Circle System Utilized Preoxygenation: Pre-oxygenation with 100% oxygen Induction Type: IV induction Ventilation: Mask ventilation without difficulty Laryngoscope Size: Miller and 2 Grade View: Grade I Tube type: Oral Tube size: 7.5 mm Number of attempts: 1 Airway Equipment and Method: Stylet Placement Confirmation: ETT inserted through vocal cords under direct vision,  positive ETCO2 and breath sounds checked- equal and bilateral Secured at: 22 cm Tube secured with: Tape Dental Injury: Teeth and Oropharynx as per pre-operative assessment

## 2017-02-24 ENCOUNTER — Encounter (HOSPITAL_COMMUNITY): Payer: Self-pay | Admitting: Orthopedic Surgery

## 2017-02-24 DIAGNOSIS — S82851K Displaced trimalleolar fracture of right lower leg, subsequent encounter for closed fracture with nonunion: Secondary | ICD-10-CM | POA: Diagnosis not present

## 2017-02-24 DIAGNOSIS — J45909 Unspecified asthma, uncomplicated: Secondary | ICD-10-CM | POA: Diagnosis present

## 2017-02-24 LAB — BASIC METABOLIC PANEL
ANION GAP: 9 (ref 5–15)
BUN: 6 mg/dL (ref 6–20)
CHLORIDE: 105 mmol/L (ref 101–111)
CO2: 22 mmol/L (ref 22–32)
Calcium: 9 mg/dL (ref 8.9–10.3)
Creatinine, Ser: 0.7 mg/dL (ref 0.44–1.00)
GFR calc non Af Amer: >60 mL/min (ref >60)
GLUCOSE: 111 mg/dL — AB (ref 65–99)
Potassium: 3.9 mmol/L (ref 3.5–5.1)
Sodium: 136 mmol/L (ref 135–145)

## 2017-02-24 LAB — CBC
HCT: 35.6 % — ABNORMAL LOW (ref 36.0–46.0)
HEMOGLOBIN: 11.8 g/dL — AB (ref 12.0–15.0)
MCH: 29.9 pg (ref 26.0–34.0)
MCHC: 33.1 g/dL (ref 30.0–36.0)
MCV: 90.4 fL (ref 78.0–100.0)
Platelets: 413 10*3/uL — ABNORMAL HIGH (ref 150–400)
RBC: 3.94 MIL/uL (ref 3.87–5.11)
RDW: 12.5 % (ref 11.5–15.5)
WBC: 14.2 10*3/uL — ABNORMAL HIGH (ref 4.0–10.5)

## 2017-02-24 MED ORDER — POLYETHYLENE GLYCOL 3350 17 G PO PACK: 17.0000 g | PACK | Freq: Every day | ORAL | 0 refills | Status: DC

## 2017-02-24 MED ORDER — ASPIRIN EC 325 MG PO TBEC: 325.0000 mg | DELAYED_RELEASE_TABLET | Freq: Two times a day (BID) | ORAL | 0 refills | Status: DC

## 2017-02-24 MED ORDER — OXYCODONE HCL 5 MG PO TABS: 5.0000 mg | ORAL_TABLET | Freq: Three times a day (TID) | ORAL | 0 refills | Status: AC | PRN

## 2017-02-24 MED ORDER — METHOCARBAMOL 500 MG PO TABS: 500.0000 mg | ORAL_TABLET | Freq: Four times a day (QID) | ORAL | 1 refills | Status: DC | PRN

## 2017-02-24 MED ORDER — HYDROCODONE-ACETAMINOPHEN 7.5-325 MG PO TABS: 1.0000 | ORAL_TABLET | ORAL | 0 refills | Status: DC | PRN

## 2017-02-24 MED ORDER — DOCUSATE SODIUM 100 MG PO CAPS: 100.0000 mg | ORAL_CAPSULE | Freq: Two times a day (BID) | ORAL | 0 refills | Status: DC

## 2017-02-24 NOTE — Progress Notes (Signed)
Orthopedic Tech Progress Note Patient Details:  Laurie Cruz 1991-07-02 [id number]  Ortho Devices Type of Ortho Device: CAM walker Ortho Device/Splint Interventions: Freeman CaldronOrdered   Odis Turck 02/24/2017, 2:15 PM

## 2017-02-24 NOTE — Progress Notes (Signed)
Physical Therapy Treatment Patient Details Name: Laurie Cruz MRN: [id number] DOB: 12-Jul-1991 Today's Date: 02/24/2017    History of Present Illness Pt admit for RIGHT FIBULAR OSTEOTOMY,OPEN REDUCTION INTERNAL FIXATION (ORIF) RIGHT ANKLE DISLOCATION,OPEN REDUCTION INTERNAL FIXATION (ORIF) RIGHT TRIMALLEOLAR  FRACTURE (Right) WITHOUT FIXATION OF POSTERIOR LIP.      PT Comments    Pt is showing progress towards goals, however limited by fatigue and elevated pulse rate reaching 163 bmp. Pt frequently required seated rest breaks during session due to feeling overheated and fatigued. Overall pt mobilizing well with transfers and bed mobilities. She is able to maintain WB status with out cueing. She would benefit from continued skilled PT to increase activity tolerance and safety with stair negotiation. Will continue to follow acutely.     Follow Up Recommendations  No PT follow up;Supervision/Assistance - 24 hour     Equipment Recommendations  Other (comment) (may need RW but most likely will progress back to crutches)    Recommendations for Other Services       Precautions / Restrictions Precautions Precautions: Fall Required Braces or Orthoses: Other Brace/Splint Other Brace/Splint: RLE temporary cast.  Restrictions Weight Bearing Restrictions: Yes RLE Weight Bearing: Non weight bearing    Mobility  Bed Mobility Overal bed mobility: Modified Independent             General bed mobility comments: Increased time   Transfers Overall transfer level: Needs assistance Equipment used: Crutches;Rolling walker (2 wheeled) Transfers: Sit to/from Stand Sit to Stand: Min guard         General transfer comment: Pt min guard for stability and min cues for hand placement for safety.  Ambulation/Gait Ambulation/Gait assistance: Min guard (+2 for follow chair.) Ambulation Distance (Feet): 5 Feet (885ft x 3, to shower chair, to & from steps) Assistive device: Crutches Gait  Pattern/deviations: Step-to pattern (Hop-to pattern. ) Gait velocity: Decreased   General Gait Details: Pt takes small hops with crutches. Maintains NWB w/o cueing. Pt fatigues quickly requiring seated rest breaks. Unable to ambulate farther due pt elevated pulse rate of 163bpm after negotiating stairs.   Stairs Stairs: Yes   Stair Management: No rails;Step to pattern;Forwards;With crutches Number of Stairs: 4 General stair comments: Pt mildly unsteady when descending stairs, requiring min A to steady. Could benefit from additional stair traning to increase safety for d/c home.  Wheelchair Mobility    Modified Rankin (Stroke Patients Only)       Balance Overall balance assessment: Needs assistance Sitting-balance support: No upper extremity supported;Feet supported Sitting balance-Leahy Scale: Good     Standing balance support: Bilateral upper extremity supported;During functional activity Standing balance-Leahy Scale: Fair Standing balance comment: Pt able to maintain static standing balance on LLE without UE support                            Cognition Arousal/Alertness: Awake/alert Behavior During Therapy: WFL for tasks assessed/performed Overall Cognitive Status: Within Functional Limits for tasks assessed                                 General Comments: Pt was pleasant during tx and motivated to get better.       Exercises      General Comments General comments (skin integrity, edema, etc.): pt required multiple seated rest breaks due to feeling overheated and fatigued.      Pertinent Vitals/Pain Pain  Assessment: 0-10 Pain Score: 8  Pain Location: RLE Pain Descriptors / Indicators: Constant;Discomfort;Grimacing;Guarding Pain Intervention(s): Monitored during session;Limited activity within patient's tolerance;Repositioned    Home Living Family/patient expects to be discharged to:: Private residence Living Arrangements:  Spouse/significant other Available Help at Discharge: Family;Available 24 hours/day Type of Home: House Home Access: Stairs to enter Entrance Stairs-Rails: None Home Layout: One level Home Equipment: Cane - single point;Hand held shower head;Crutches;Wheelchair - manual      Prior Function Level of Independence: Independent      Comments: ADLs, IADLs, and working as a Runner, broadcasting/film/videoteacher   PT Goals (current goals can now be found in the care plan section) Acute Rehab PT Goals Patient Stated Goal: To go home.  PT Goal Formulation: With patient Time For Goal Achievement: 03/09/17 Potential to Achieve Goals: Good Progress towards PT goals: Progressing toward goals    Frequency    Min 6X/week      PT Plan Current plan remains appropriate    Co-evaluation PT/OT/SLP Co-Evaluation/Treatment: Yes   PT goals addressed during session: Mobility/safety with mobility;Proper use of DME OT goals addressed during session: ADL's and self-care      AM-PAC PT "6 Clicks" Daily Activity  Outcome Measure  Difficulty turning over in bed (including adjusting bedclothes, sheets and blankets)?: None Difficulty moving from lying on back to sitting on the side of the bed? : None Difficulty sitting down on and standing up from a chair with arms (e.g., wheelchair, bedside commode, etc,.)?: Total Help needed moving to and from a bed to chair (including a wheelchair)?: A Little Help needed walking in hospital room?: A Little Help needed climbing 3-5 steps with a railing? : A Little 6 Click Score: 18    End of Session Equipment Utilized During Treatment: Gait belt Activity Tolerance: Patient tolerated treatment well;Patient limited by fatigue;Treatment limited secondary to medical complications (Comment) (elevated pulse rate) Patient left: with call bell/phone within reach;with family/visitor present;in bed Nurse Communication: Mobility status PT Visit Diagnosis: Unsteadiness on feet (R26.81);Muscle  weakness (generalized) (M62.81);Pain Pain - Right/Left: Right Pain - part of body: Ankle and joints of foot     Time: [id number] PT Time Calculation (min) (ACUTE ONLY): 44 min  Charges:  $Gait Training: 23-37 mins                    G Codes:       Kallie LocksHannah Pharrah Rottman, [place]A Pager [phone] Acute Rehab    Sheral ApleyHannah E Daphnee Preiss 02/24/2017, 12:04 PM

## 2017-02-24 NOTE — Discharge Summary (Signed)
Orthopaedic Trauma Service (OTS)  Patient ID: Laurie Cruz MRN: [id number] DOB/AGE: 27-20-1992 27 y.o.  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 Carola FrostHandy 1. Right fibular osteotomy. 2. Open reduction of right ankle chronic dislocation. 3. Open reduction and internal fixation of right trimalleolar ankle     fracture without fixation of the posterior lip.   Discharged Condition: good  Hospital Course:   Very pleasant 26 year old black female who unfortunately sustained an injury proximal to 6 weeks ago to her right ankle while she was in [place]. Patient was seen and evaluated in urgent care in [place] and was told that she had an ankle sprain. From that point forward she was weightbearing as tolerated on her right ankle. She continued to have pain and discomfort and when she returned to [place] she went to an urgent care which demonstrated a displaced right trimalleolar ankle fracture dislocation. She was referred to a local orthopedist who felt that this was at a scope of his practice because it had been so long since the injury. Patient was referred to the orthopedic trauma specialists for definitive management. Patient was taken to the OR on the date noted above which is approximately 6 weeks out from injury. 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 Procedure noted above was performed. Patient tolerated the procedure very well. After surgery she was transferred to the PACU for recovery from anesthesia and transferred to the orthopedic floor for observation, pain control and therapies. Patient's  hospital stay was uncomplicated. She was started on Lovenox on postoperative day #1 for DVT and PE prophylaxis. She was covered with Ancef for routine surgical prophylaxis. She started working with therapies on postoperative day #1 and did very well. Her block did wear off early morning on postoperative day 1. Her pain was otherwise well controlled. She was tolerating a diet and voiding without difficulty on postoperative day #1. Patient requested discharge to home she was doing so well on postoperative day #1. Patient discharged in stable condition on 02/25/2012. She'll follow-up with orthopedics in 2 weeks for splint removal and placement into a CAM walker. She'll Be Nonweightbearing for Total of 6 Weeks from the day of surgery   Consults: None  Significant Diagnostic Studies: labs:  Results for Laurie AlkenROBERTS, Lamoyne M (MRN [id number]) as of 02/24/2017 12:50  Ref. Range 02/24/2017 03:21  Sodium Latest Ref Range: 135 - 145 mmol/L 136  Potassium Latest Ref Range: 3.5 - 5.1 mmol/L 3.9  Chloride Latest Ref Range: 101 - 111 mmol/L 105  CO2 Latest Ref Range: 22 - 32 mmol/L 22  Glucose Latest Ref Range: 65 - 99 mg/dL 244111 (H)  BUN Latest Ref Range: 6 - 20 mg/dL 6  Creatinine Latest Ref Range: 0.44 - 1.00 mg/dL 0.100.70  Calcium Latest Ref Range: 8.9 - 10.3 mg/dL 9.0  Anion gap Latest Ref Range: 5 - 15  9  GFR, Est African American Latest Ref Range: >60 mL/min >60  GFR, Est Non African American Latest Ref Range: >60 mL/min >60  WBC Latest Ref Range: 4.0 - 10.5 K/uL 14.2 (H)  RBC Latest Ref Range: 3.87 - 5.11 MIL/uL 3.94  Hemoglobin Latest Ref  Range: 12.0 - 15.0 g/dL 45.4 (L)  HCT Latest Ref Range: 36.0 - 46.0 % 35.6 (L)  MCV Latest Ref Range: 78.0 - 100.0 fL 90.4  MCH Latest Ref Range: 26.0 - 34.0 pg 29.9  MCHC Latest Ref Range: 30.0 - 36.0 g/dL 09.8  RDW Latest Ref Range: 11.5 - 15.5 % 12.5  Platelets Latest Ref Range: 150 - 400 K/uL 413 (H)    Results for KARYME, MCCONATHY (MRN [id number]) as of  02/24/2017 12:50  Ref. Range 02/23/2017 22:22  Appearance Latest Ref Range: CLEAR  CLEAR  Bilirubin Urine Latest Ref Range: NEGATIVE  NEGATIVE  Color, Urine Latest Ref Range: YELLOW  YELLOW  Glucose Latest Ref Range: NEGATIVE mg/dL NEGATIVE  Hgb urine dipstick Latest Ref Range: NEGATIVE  NEGATIVE  Ketones, ur Latest Ref Range: NEGATIVE mg/dL NEGATIVE  Leukocytes, UA Latest Ref Range: NEGATIVE  NEGATIVE  Nitrite Latest Ref Range: NEGATIVE  NEGATIVE  pH Latest Ref Range: 5.0 - 8.0  6.0  Protein Latest Ref Range: NEGATIVE mg/dL NEGATIVE  Specific Gravity, Urine Latest Ref Range: 1.005 - 1.030  1.018   Treatments: IV hydration, antibiotics: Ancef, analgesia: Dilaudid and Norco, anticoagulation: LMW heparin and aspirin at discharge, therapies: PT, OT and RN and surgery: As above  Discharge Exam:       Orthopedic Trauma Service Progress Note      Subjective:   Doing well No specific complaints other than being unable to get some sleep Tolerating diet Voiding without difficulty   Block early this morning Pain is tolerable   Did well with therapy    Review of Systems  Constitutional: Negative for chills and fever.  Cardiovascular: Negative for chest pain and palpitations.  Gastrointestinal: Negative for nausea and vomiting.  Neurological: Negative for tingling and sensory change.      Objective:    VITALS:         Vitals:    02/24/17 0351 02/24/17 0413 02/24/17 0858 02/24/17 1200  BP:   125/64      Pulse:   60      Resp: 15 16 16 13   Temp:   97.7 F (36.5 C)      TempSrc:   Oral      SpO2: 100% 100% 100% 96%  Weight:          Height:              Intake/Output      08/03 0701 - 08/04 0700 08/04 0701 - 08/05 0700   I.V. (mL/kg) 1625 (14.2) 1800 (15.7)   IV Piggyback 50    Total Intake(mL/kg) 1675 (14.6) 1800 (15.7)   Urine (mL/kg/hr) 950 (0.3)    Blood 150    Total Output 1100     Net +575 +1800           LABS   Lab Results Last 24 Hours       Results for  orders placed or performed during the hospital encounter of 02/23/17 (from the past 24 hour(s))  Urinalysis, Routine w reflex microscopic     Status: None    Collection Time: 02/23/17 10:22 PM  Result Value Ref Range    Color, Urine YELLOW YELLOW    APPearance CLEAR CLEAR    Specific Gravity, Urine 1.018 1.005 - 1.030    pH 6.0 5.0 - 8.0    Glucose, UA NEGATIVE NEGATIVE mg/dL    Hgb urine dipstick NEGATIVE NEGATIVE    Bilirubin Urine NEGATIVE NEGATIVE    Ketones,  ur NEGATIVE NEGATIVE mg/dL    Protein, ur NEGATIVE NEGATIVE mg/dL    Nitrite NEGATIVE NEGATIVE    Leukocytes, UA NEGATIVE NEGATIVE  Basic metabolic panel     Status: Abnormal    Collection Time: 02/24/17  3:21 AM  Result Value Ref Range    Sodium 136 135 - 145 mmol/L    Potassium 3.9 3.5 - 5.1 mmol/L    Chloride 105 101 - 111 mmol/L    CO2 22 22 - 32 mmol/L    Glucose, Bld 111 (H) 65 - 99 mg/dL    BUN 6 6 - 20 mg/dL    Creatinine, Ser 7.82 0.44 - 1.00 mg/dL    Calcium 9.0 8.9 - 95.6 mg/dL    GFR calc non Af Amer >60 >60 mL/min    GFR calc Af Amer >60 >60 mL/min    Anion gap 9 5 - 15  CBC     Status: Abnormal    Collection Time: 02/24/17  3:21 AM  Result Value Ref Range    WBC 14.2 (H) 4.0 - 10.5 K/uL    RBC 3.94 3.87 - 5.11 MIL/uL    Hemoglobin 11.8 (L) 12.0 - 15.0 g/dL    HCT 21.3 (L) 08.6 - 46.0 %    MCV 90.4 78.0 - 100.0 fL    MCH 29.9 26.0 - 34.0 pg    MCHC 33.1 30.0 - 36.0 g/dL    RDW 57.8 46.9 - 62.9 %    Platelets 413 (H) 150 - 400 K/uL          PHYSICAL EXAM:    Gen: Resting comfortably in bed, no acute distress appears well Lungs: Lungs are clear to auscultation bilaterally Cardiac: Regular rhythm, S1 and S2 Abd: + BS, NTND Ext:       Right Lower Extremity              Splint is clean dry and intact             Extremity is warm             + DP pulse             No pain with passive stretching             DPN, SPN, TN sensory functions are grossly intact             FHL and lesser toe  flexion are intact             Weak EHL function   Assessment/Plan: 1 Day Post-Op    Principal Problem:   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 02/23/17 0606 02/23/17 0830     .   POD/HD#: 35   26 year old black female with right trimalleolar ankle fracture dislocation malunion     -Right trimalleolar ankle fracture dislocation malunion status post ORIF  Nonweightbearing for 6 weeks             Splint 2 weeks then transition to a cam boot where she will begin range of motion of her ankle             Crutches or walker for ambulation             Continue with ice and elevation             Patient does not need to perform any dressing changes as she is splinted             Okay to move toes and knee as much as possible             Follow-up with orthopedics in 2 weeks   - Pain management:             discharge home on Norco, Robaxin   - ABL anemia/Hemodynamics             Stable   - Medical issues              None   - DVT/PE prophylaxis:             Aspirin 325 one every 12 hours for the next 6 weeks   - ID:              Perioperative antibiotics completed   - Activity:             Nonweightbearing right leg   - FEN/GI prophylaxis/Foley/Lines:             Regular diet              -Ex-fix/Splint care:             Do not get splint wet             Do not remove splint   - Dispo:             DC home today             Follow-up with orthopedics in 10-14 days     Disposition: Home  Discharge Instructions     Call MD / Call 911    Complete by:  As directed    If you experience chest pain or shortness of breath, CALL 911 and be transported to the hospital emergency room.  If you develope a fever above 101 F, pus (white drainage) or increased drainage or redness at the wound, or calf pain, call your surgeon's office.   Constipation Prevention    Complete by:  As directed    Drink plenty of fluids.  Prune juice may be helpful.  You may use a stool softener, such as Colace (over the counter) 100 mg twice a day.  Use MiraLax (over the counter) for constipation as needed.   Diet general    Complete by:  As directed    Discharge instructions    Complete by:  As directed    Orthopaedic Trauma Service Discharge Instructions   General Discharge Instructions  WEIGHT BEARING STATUS: Nonweightbearing right leg  RANGE OF MOTION/ACTIVITY: Okay to move toes and knee. Do not remove splint  Wound Care: Do not remove splint. Do not get splint wet. We will remove splint first follow-up visit  DVT/PE prophylaxis: Aspirin 325 mg 1 pill 2 times a day  Diet: as you were eating previously.  Can use over the counter stool softeners and bowel preparations,  such as Miralax, to help with bowel movements.  Narcotics can be constipating.  Be sure to drink plenty of fluids  PAIN MEDICATION USE AND EXPECTATIONS  You have likely been given narcotic medications to help control your pain.  After a traumatic event that results in an fracture (broken bone) with or without surgery, it is ok to use narcotic pain medications to help control one's pain.  We understand that everyone responds to pain differently and each individual patient will be evaluated on a regular basis for the continued need for narcotic medications. Ideally, narcotic medication use should last no more than 6-8 weeks (coinciding with fracture healing).   As a patient it is your responsibility as well to monitor narcotic medication use and report the amount and  frequency you use these medications when you come to your office visit.   We would also advise that if you are using narcotic medications, you should take a dose prior to therapy to maximize you participation.  IF YOU ARE ON NARCOTIC MEDICATIONS IT IS NOT PERMISSIBLE TO OPERATE A MOTOR VEHICLE (MOTORCYCLE/CAR/TRUCK/MOPED) OR HEAVY MACHINERY DO NOT MIX NARCOTICS WITH OTHER CNS (CENTRAL NERVOUS SYSTEM) DEPRESSANTS SUCH AS ALCOHOL   STOP SMOKING OR USING NICOTINE PRODUCTS!!!!  As discussed nicotine severely impairs your body's ability to heal surgical and traumatic wounds but also impairs bone healing.  Wounds and bone heal by forming microscopic blood vessels (angiogenesis) and nicotine is a vasoconstrictor (essentially, shrinks blood vessels).  Therefore, if vasoconstriction occurs to these microscopic blood vessels they essentially disappear and are unable to deliver necessary nutrients to the healing tissue.  This is one modifiable factor that you can do to dramatically increase your chances of healing your injury.    (This means no smoking, no nicotine gum, patches, etc)  DO NOT USE NONSTEROIDAL ANTI-INFLAMMATORY DRUGS (NSAID'S)  Using products such as Advil (ibuprofen), Aleve (naproxen), Motrin (ibuprofen) for additional pain control during fracture healing can delay and/or prevent the healing response.  If you would like to take over the counter (OTC) medication, Tylenol (acetaminophen) is ok.  However, some narcotic medications that are given for pain control contain acetaminophen as well. Therefore, you should not exceed more than 4000 mg of tylenol in a day if you do not have liver disease.  Also note that there are may OTC medicines, such as cold medicines and allergy medicines that my contain tylenol as well.  If you have any questions about medications and/or interactions please ask your doctor/PA or your pharmacist.      ICE AND ELEVATE INJURED/OPERATIVE EXTREMITY  Using ice and elevating  the injured extremity above your heart can help with swelling and pain control.  Icing in a pulsatile fashion, such as 20 minutes on and 20 minutes off, can be followed.    Do not place ice directly on skin. Make sure there is a barrier between to skin and the ice pack.    Using frozen items such as frozen peas works well as the conform nicely to the are that needs to be iced.  USE AN ACE WRAP OR TED HOSE FOR SWELLING CONTROL  In addition to icing and elevation, Ace wraps or TED hose are used to help limit and resolve swelling.  It is recommended to use Ace wraps or TED hose until you are informed to stop.    When using Ace Wraps start the wrapping distally (farthest away from the body) and wrap proximally (closer to the body)   Example: If  you had surgery on your leg or thing and you do not have a splint on, start the ace wrap at the toes and work your way up to the thigh        If you had surgery on your upper extremity and do not have a splint on, start the ace wrap at your fingers and work your way up to the upper arm  IF YOU ARE IN A SPLINT OR CAST DO NOT REMOVE IT FOR ANY REASON   If your splint gets wet for any reason please contact the office immediately. You may shower in your splint or cast as long as you keep it dry.  This can be done by wrapping in a cast cover or garbage back (or similar)  Do Not stick any thing down your splint or cast such as pencils, money, or hangers to try and scratch yourself with.  If you feel itchy take benadryl as prescribed on the bottle for itching  IF YOU ARE IN A CAM BOOT (BLACK BOOT)  You may remove boot periodically. Perform daily dressing changes as noted below.  Wash the liner of the boot regularly and wear a sock when wearing the boot. It is recommended that you sleep in the boot until told otherwise  CALL THE OFFICE WITH ANY QUESTIONS OR CONCERNS: [phone]   Driving restrictions    Complete by:  As directed    No driving   Increase activity  slowly as tolerated    Complete by:  As directed    Non weight bearing    Complete by:  As directed    Laterality:  right   Extremity:  Lower     Allergies as of 02/24/2017   No Known Allergies     Medication List    TAKE these medications   aspirin EC 325 MG tablet Take 1 tablet (325 mg total) by mouth every 12 (twelve) hours.   docusate sodium 100 MG capsule Commonly known as:  COLACE Take 1 capsule (100 mg total) by mouth 2 (two) times daily.   HYDROcodone-acetaminophen 7.5-325 MG tablet Commonly known as:  NORCO Take 1-2 tablets by mouth every 4 (four) hours as needed (breakthrough pain).   methocarbamol 500 MG tablet Commonly known as:  ROBAXIN Take 1-2 tablets (500-1,000 mg total) by mouth every 6 (six) hours as needed for muscle spasms.   oxyCODONE 5 MG immediate release tablet Commonly known as:  ROXICODONE Take 1-2 tablets (5-10 mg total) by mouth every 8 (eight) hours as needed for breakthrough pain (Take between hydrocodone only for unresolved breakthrough pain).   polyethylene glycol packet Commonly known as:  MIRALAX / GLYCOLAX Take 17 g by mouth daily.        Discharge Instructions and Plan:  26 year old black female with right trimalleolar ankle fracture dislocation malunion     -Right trimalleolar ankle fracture dislocation malunion status post ORIF             Nonweightbearing for 6 weeks             Splint 2 weeks then transition to a cam boot where she will begin range of motion of her ankle             Crutches or walker for ambulation             Continue with ice and elevation             Patient does not need to perform any dressing changes  as she is splinted             Okay to move toes and knee as much as possible             Follow-up with orthopedics in 2 weeks   Will order CAM boot prior to discharge. Patient will bring CAM boot to her first postoperative visit   - Pain management:             discharge home on Norco, Robaxin    - ABL anemia/Hemodynamics             Stable   - Medical issues              None   - DVT/PE prophylaxis:             Aspirin 325 one every 12 hours for the next 6 weeks   - ID:              Perioperative antibiotics completed   - Activity:             Nonweightbearing right leg   - FEN/GI prophylaxis/Foley/Lines:             Regular diet              -Ex-fix/Splint care:             Do not get splint wet             Do not remove splint   - Dispo:             DC home today             Follow-up with orthopedics in 10-14 days  Signed:  Mearl LatinKeith W. Ival Basquez, PA-C [facility] [phone] (P) 02/24/2017, 12:58 PM

## 2017-02-24 NOTE — Care Management Note (Signed)
Case Management Note  Patient Details  Name: Laurie Cruz MRN: [id number] Date of Birth: 04/06/1991  Subjective/Objective:                 Spoke w patient's nurse. She expressed need for WC > RW (cannot supply both through charity services) due to NWB status 2/2 ORIF ankle. [facility]. No other CM needs identified at this time.    Action/Plan:   Expected Discharge Date:  02/24/17               Expected Discharge Plan:  Home/Self Care  In-House Referral:     Discharge planning Services  CM Consult  Post Acute Care Choice:  Durable Medical Equipment Choice offered to:     DME Arranged:  Wheelchair manual DME Agency:  [facility]  [facility]     Status of Service:  Completed, signed off  If discussed at Long Length of Stay Meetings, dates discussed:    Additional Comments:  Lawerance SabalDebbie Chesnee Floren, RN 02/24/2017, 1:47 PM

## 2017-02-24 NOTE — Progress Notes (Signed)
Orthopedic Trauma Service Progress Note    Subjective:  Doing well No specific complaints other than being unable to get some sleep Tolerating diet Voiding without difficulty  Block early this morning Pain is tolerable  Did well with therapy   Review of Systems  Constitutional: Negative for chills and fever.  Cardiovascular: Negative for chest pain and palpitations.  Gastrointestinal: Negative for nausea and vomiting.  Neurological: Negative for tingling and sensory change.    Objective:   VITALS:   Vitals:   02/24/17 0351 02/24/17 0413 02/24/17 0858 02/24/17 1200  BP:  125/64    Pulse:  60    Resp: 15 16 16 13   Temp:  97.7 F (36.5 C)    TempSrc:  Oral    SpO2: 100% 100% 100% 96%  Weight:      Height:        Intake/Output      08/03 0701 - 08/04 0700 08/04 0701 - 08/05 0700   I.V. (mL/kg) 1625 (14.2) 1800 (15.7)   IV Piggyback 50    Total Intake(mL/kg) 1675 (14.6) 1800 (15.7)   Urine (mL/kg/hr) 950 (0.3)    Blood 150    Total Output 1100     Net +575 +1800          LABS  Results for orders placed or performed during the hospital encounter of 02/23/17 (from the past 24 hour(s))  Urinalysis, Routine w reflex microscopic     Status: None   Collection Time: 02/23/17 10:22 PM  Result Value Ref Range   Color, Urine YELLOW YELLOW   APPearance CLEAR CLEAR   Specific Gravity, Urine 1.018 1.005 - 1.030   pH 6.0 5.0 - 8.0   Glucose, UA NEGATIVE NEGATIVE mg/dL   Hgb urine dipstick NEGATIVE NEGATIVE   Bilirubin Urine NEGATIVE NEGATIVE   Ketones, ur NEGATIVE NEGATIVE mg/dL   Protein, ur NEGATIVE NEGATIVE mg/dL   Nitrite NEGATIVE NEGATIVE   Leukocytes, UA NEGATIVE NEGATIVE  Basic metabolic panel     Status: Abnormal   Collection Time: 02/24/17  3:21 AM  Result Value Ref Range   Sodium 136 135 - 145 mmol/L   Potassium 3.9 3.5 - 5.1 mmol/L   Chloride 105 101 - 111 mmol/L   CO2 22 22 - 32 mmol/L   Glucose, Bld 111 (H) 65 - 99 mg/dL   BUN 6 6 - 20 mg/dL   Creatinine, Ser 1.610.70 0.44 - 1.00 mg/dL   Calcium 9.0 8.9 - 09.610.3 mg/dL   GFR calc non Af Amer >60 >60 mL/min   GFR calc Af Amer >60 >60 mL/min   Anion gap 9 5 - 15  CBC     Status: Abnormal   Collection Time: 02/24/17  3:21 AM  Result Value Ref Range   WBC 14.2 (H) 4.0 - 10.5 K/uL   RBC 3.94 3.87 - 5.11 MIL/uL   Hemoglobin 11.8 (L) 12.0 - 15.0 g/dL   HCT 04.535.6 (L) 40.936.0 - 81.146.0 %   MCV 90.4 78.0 - 100.0 fL   MCH 29.9 26.0 - 34.0 pg   MCHC 33.1 30.0 - 36.0 g/dL   RDW 91.412.5 78.211.5 - 95.615.5 %   Platelets 413 (H) 150 - 400 K/uL     PHYSICAL EXAM:   Gen: Resting comfortably in bed, no acute distress appears well Lungs: Lungs are clear to auscultation bilaterally Cardiac: Regular rhythm, S1 and S2 Abd: + BS, NTND Ext:       Right Lower Extremity   Splint is clean  dry and intact  Extremity is warm  + DP pulse  No pain with passive stretching  DPN, SPN, TN sensory functions are grossly intact  FHL and lesser toe flexion are intact  Weak EHL function  Assessment/Plan: 1 Day Post-Op   Principal Problem:   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 02/23/17 0606 02/23/17 0830    .  POD/HD#: 741  26 year old black female with right trimalleolar ankle fracture dislocation malunion   -Right trimalleolar ankle fracture dislocation malunion status post ORIF  Nonweightbearing for 6 weeks  Splint 2 weeks then transition to a cam boot where she will begin range of motion of her ankle  Crutches or walker for ambulation  Continue with ice and  elevation  Patient does not need to perform any dressing changes as she is splinted  Okay to move toes and knee as much as possible  Follow-up with orthopedics in 2 weeks  - Pain management:  discharge home on Norco, Robaxin  - ABL anemia/Hemodynamics  Stable  - Medical issues   None  - DVT/PE prophylaxis:  Aspirin 325 one every 12 hours for the next 6 weeks  - ID:   Perioperative antibiotics completed  - Activity:  Nonweightbearing right leg  - FEN/GI prophylaxis/Foley/Lines:  Regular diet   -Ex-fix/Splint care:  Do not get splint wet  Do not remove splint  - Dispo:  DC home today  Follow-up with orthopedics in 10-14 days     Mearl LatinKeith W. Danique Hartsough, PA-C [facility] [phone] (P) [phone] (O) 02/24/2017, 12:44 PM

## 2017-02-24 NOTE — Discharge Instructions (Addendum)
Orthopaedic Trauma Service Discharge Instructions   General Discharge Instructions  WEIGHT BEARING STATUS: Nonweightbearing right leg  Bring black boot/CAM boot with you to your first postoperative visit  RANGE OF MOTION/ACTIVITY: Okay to move toes and knee. Do not remove splint  Wound Care: Do not remove splint. Do not get splint wet. We will remove splint first follow-up visit  DVT/PE prophylaxis: Aspirin 325 mg 1 pill 2 times a day  Diet: as you were eating previously.  Can use over the counter stool softeners and bowel preparations, such as Miralax, to help with bowel movements.  Narcotics can be constipating.  Be sure to drink plenty of fluids  PAIN MEDICATION USE AND EXPECTATIONS  You have likely been given narcotic medications to help control your pain.  After a traumatic event that results in an fracture (broken bone) with or without surgery, it is ok to use narcotic pain medications to help control one's pain.  We understand that everyone responds to pain differently and each individual patient will be evaluated on a regular basis for the continued need for narcotic medications. Ideally, narcotic medication use should last no more than 6-8 weeks (coinciding with fracture healing).   As a patient it is your responsibility as well to monitor narcotic medication use and report the amount and frequency you use these medications when you come to your office visit.   We would also advise that if you are using narcotic medications, you should take a dose prior to therapy to maximize you participation.  IF YOU ARE ON NARCOTIC MEDICATIONS IT IS NOT PERMISSIBLE TO OPERATE A MOTOR VEHICLE (MOTORCYCLE/CAR/TRUCK/MOPED) OR HEAVY MACHINERY DO NOT MIX NARCOTICS WITH OTHER CNS (CENTRAL NERVOUS SYSTEM) DEPRESSANTS SUCH AS ALCOHOL   STOP SMOKING OR USING NICOTINE PRODUCTS!!!!  As discussed nicotine severely impairs your body's ability to heal surgical and traumatic wounds but also impairs bone  healing.  Wounds and bone heal by forming microscopic blood vessels (angiogenesis) and nicotine is a vasoconstrictor (essentially, shrinks blood vessels).  Therefore, if vasoconstriction occurs to these microscopic blood vessels they essentially disappear and are unable to deliver necessary nutrients to the healing tissue.  This is one modifiable factor that you can do to dramatically increase your chances of healing your injury.    (This means no smoking, no nicotine gum, patches, etc)  DO NOT USE NONSTEROIDAL ANTI-INFLAMMATORY DRUGS (NSAID'S)  Using products such as Advil (ibuprofen), Aleve (naproxen), Motrin (ibuprofen) for additional pain control during fracture healing can delay and/or prevent the healing response.  If you would like to take over the counter (OTC) medication, Tylenol (acetaminophen) is ok.  However, some narcotic medications that are given for pain control contain acetaminophen as well. Therefore, you should not exceed more than 4000 mg of tylenol in a day if you do not have liver disease.  Also note that there are may OTC medicines, such as cold medicines and allergy medicines that my contain tylenol as well.  If you have any questions about medications and/or interactions please ask your doctor/PA or your pharmacist.      ICE AND ELEVATE INJURED/OPERATIVE EXTREMITY  Using ice and elevating the injured extremity above your heart can help with swelling and pain control.  Icing in a pulsatile fashion, such as 20 minutes on and 20 minutes off, can be followed.    Do not place ice directly on skin. Make sure there is a barrier between to skin and the ice pack.    Using frozen items such as  frozen peas works well as the conform nicely to the are that needs to be iced.  USE AN ACE WRAP OR TED HOSE FOR SWELLING CONTROL  In addition to icing and elevation, Ace wraps or TED hose are used to help limit and resolve swelling.  It is recommended to use Ace wraps or TED hose until you are  informed to stop.    When using Ace Wraps start the wrapping distally (farthest away from the body) and wrap proximally (closer to the body)   Example: If you had surgery on your leg or thing and you do not have a splint on, start the ace wrap at the toes and work your way up to the thigh        If you had surgery on your upper extremity and do not have a splint on, start the ace wrap at your fingers and work your way up to the upper arm  IF YOU ARE IN A SPLINT OR CAST DO NOT REMOVE IT FOR ANY REASON   If your splint gets wet for any reason please contact the office immediately. You may shower in your splint or cast as long as you keep it dry.  This can be done by wrapping in a cast cover or garbage back (or similar)  Do Not stick any thing down your splint or cast such as pencils, money, or hangers to try and scratch yourself with.  If you feel itchy take benadryl as prescribed on the bottle for itching  IF YOU ARE IN A CAM BOOT (BLACK BOOT)  You may remove boot periodically. Perform daily dressing changes as noted below.  Wash the liner of the boot regularly and wear a sock when wearing the boot. It is recommended that you sleep in the boot until told otherwise  CALL THE OFFICE WITH ANY QUESTIONS OR CONCERNS: [phone]

## 2017-02-24 NOTE — Evaluation (Addendum)
Occupational Therapy Evaluation Patient Details Name: Laurie Cruz MRN: [id number] DOB: 03-28-1991 Today's Date: 02/24/2017    History of Present Illness Pt admit for RIGHT FIBULAR OSTEOTOMY,OPEN REDUCTION INTERNAL FIXATION (ORIF) RIGHT ANKLE DISLOCATION,OPEN REDUCTION INTERNAL FIXATION (ORIF) RIGHT TRIMALLEOLAR  FRACTURE (Right) WITHOUT FIXATION OF POSTERIOR LIP.     Clinical Impression   PTA, pt was living with her significant other and was independent and working. Currently, pt performing ADLs and functional mobility at a [employer] level. Provided education on LB dressing, tub transfer, and toileting. Pt demonstrated understanding while adhering to NWB status. Recommend dc home once medically stable per physician. Provided education and answered pt's questions in preparation for possible dc later today. All acute OT needs met and will sign off. Thank you.     Follow Up Recommendations  DC plan and follow up therapy as arranged by surgeon;Supervision/Assistance - 24 hour    Equipment Recommendations  None recommended by OT    Recommendations for Other Services PT consult     Precautions / Restrictions Precautions Precautions: Fall Required Braces or Orthoses: Other Brace/Splint Other Brace/Splint: RLE temporary cast.  Restrictions Weight Bearing Restrictions: Yes RLE Weight Bearing: Non weight bearing      Mobility Bed Mobility Overal bed mobility: Modified Independent             General bed mobility comments: Increased time   Transfers Overall transfer level: Needs assistance Equipment used: Crutches;Rolling walker (2 wheeled) Transfers: Sit to/from Stand Sit to Stand: Min guard         General transfer comment: Pt min guard for stability and min cues for hand placement for safety.    Balance Overall balance assessment: Needs assistance Sitting-balance support: No upper extremity supported;Feet supported Sitting balance-Leahy Scale: Good      Standing balance support: Bilateral upper extremity supported;During functional activity Standing balance-Leahy Scale: Fair Standing balance comment: Pt able to maintain static standing balance on LLE without UE support                           ADL either performed or assessed with clinical judgement   ADL Overall ADL's : Needs assistance/impaired Eating/Feeding: Set up;Sitting   Grooming: Set up;Sitting   Upper Body Bathing: Min guard;Sitting   Lower Body Bathing: Min guard;Sit to/from stand   Upper Body Dressing : Min guard;Sitting   Lower Body Dressing: Min guard;Sit to/from stand;Cueing for sequencing Lower Body Dressing Details (indicate cue type and reason): Pt donned underwear with Min Guard A for safety and Min VCs for sequencing and safe technique Toilet Transfer: Min guard;Ambulation (Crutches. Simulated to recliner)       Tub/ Shower Transfer: Tub transfer;Min guard;Ambulation;Shower seat (Crutches.) Clinical cytogeneticist Details (indicate cue type and reason): Educated pt on tub transfer and sponge bathing techniques. Pt demonstrated understanding of transfer with Min Guard A for safety.  Functional mobility during ADLs: Min guard (Crutches) General ADL Comments: Provided education on LB dressing, tub transfer, and toileting. Pt demonstrating and verbalizing understanding.     Vision         Perception     Praxis      Pertinent Vitals/Pain Pain Assessment: 0-10 Pain Score: 8  Pain Location: RLE Pain Descriptors / Indicators: Constant;Discomfort;Grimacing;Guarding Pain Intervention(s): Monitored during session;Limited activity within patient's tolerance;Repositioned     Hand Dominance Right   Extremity/Trunk Assessment Upper Extremity Assessment Upper Extremity Assessment: Overall WFL for tasks assessed   Lower Extremity Assessment Lower  Extremity Assessment: Defer to PT evaluation   Cervical / Trunk Assessment Cervical / Trunk  Assessment: Normal   Communication Communication Communication: No difficulties   Cognition Arousal/Alertness: Awake/alert Behavior During Therapy: WFL for tasks assessed/performed Overall Cognitive Status: Within Functional Limits for tasks assessed                                 General Comments: Pt was pleasant during tx and motivated to get better.    General Comments  pt required multiple seated rest breaks due to feeling overheated and fatigued.    Exercises     Shoulder Instructions      Home Living Family/patient expects to be discharged to:: Private residence Living Arrangements: Spouse/significant other Available Help at Discharge: Family;Available 24 hours/day Type of Home: House Home Access: Stairs to enter [employer] of Steps: 3 Entrance Stairs-Rails: None Home Layout: One level     Bathroom Shower/Tub: Tub/shower unit;Curtain   Biochemist, clinical: Standard     Home Equipment: Cane - single point;Hand held shower head;Crutches;Wheelchair - manual          Prior Functioning/Environment Level of Independence: Independent        Comments: ADLs, IADLs, and working as a Actor Problem List: Decreased strength;Decreased activity tolerance;Impaired balance (sitting and/or standing)      OT Treatment/Interventions:      OT Goals(Current goals can be found in the care plan section) Acute Rehab OT Goals Patient Stated Goal: To go home.  OT Goal Formulation: With patient Time For Goal Achievement: 03/10/17 Potential to Achieve Goals: Good  OT Frequency:     Barriers to D/C:            Co-evaluation     PT goals addressed during session: Mobility/safety with mobility;Proper use of DME OT goals addressed during session: ADL's and self-care      AM-PAC PT "6 Clicks" Daily Activity     Outcome Measure Help from another person eating meals?: None Help from another person taking care of personal grooming?: A  Little Help from another person toileting, which includes using toliet, bedpan, or urinal?: A Little Help from another person bathing (including washing, rinsing, drying)?: A Little Help from another person to put on and taking off regular upper body clothing?: None Help from another person to put on and taking off regular lower body clothing?: A Little 6 Click Score: 20   End of Session Equipment Utilized During Treatment: Gait belt;Other (comment) (Crutches) Nurse Communication: Mobility status;Weight bearing status;Precautions  Activity Tolerance: Patient tolerated treatment well Patient left: in chair;with call bell/phone within reach (With PT)  OT Visit Diagnosis: Unsteadiness on feet (R26.81);Other abnormalities of gait and mobility (R26.89);Pain Pain - Right/Left: Right Pain - part of body: Leg                Time: 9292-4462 OT Time Calculation (min): 55 min Charges:  OT General Charges $OT Visit: 1 Procedure OT Evaluation $OT Eval Low Complexity: 1 Procedure OT Treatments $Self Care/Home Management : 8-22 mins G-Codes: OT G-codes **NOT FOR INPATIENT CLASS** Functional Assessment Tool Used: Clinical judgement Functional Limitation: Self care Self Care Current Status (M6381): At least 1 percent but less than 20 percent impaired, limited or restricted Self Care Goal Status (R7116): At least 1 percent but less than 20 percent impaired, limited or restricted Self Care Discharge Status [phone]): At least 1  percent but less than 20 percent impaired, limited or restricted   [place], OTR/L Acute Rehab Pager: [phone] Office: [place] 02/24/2017, 12:55 PM

## 2017-02-24 NOTE — Progress Notes (Signed)
Discontinued PCA at 1326H per order. Oral pain meds given to the patient. O2 sat remains 96-100%.

## 2017-02-24 NOTE — Progress Notes (Signed)
RW was not delivered. Notified Montez MoritaPaul Keith. He said still ok to d/c the patient.  Korie (CN) was notified to follow up tomorrow the delivery of RW to the patient's house. Currently patient has wheelchair and crutches at home.

## 2017-02-25 LAB — URINE CULTURE: Culture: NO GROWTH

## 2017-02-27 ENCOUNTER — Encounter (HOSPITAL_COMMUNITY): Payer: Self-pay | Admitting: Orthopedic Surgery

## 2017-03-07 ENCOUNTER — Ambulatory Visit: Payer: Self-pay

## 2017-03-08 ENCOUNTER — Ambulatory Visit (INDEPENDENT_AMBULATORY_CARE_PROVIDER_SITE_OTHER): Payer: Self-pay | Admitting: Physician Assistant

## 2019-03-11 ENCOUNTER — Ambulatory Visit: Payer: Self-pay

## 2019-05-15 ENCOUNTER — Other Ambulatory Visit: Payer: Self-pay

## 2019-05-15 DIAGNOSIS — Z20822 Contact with and (suspected) exposure to covid-19: Secondary | ICD-10-CM

## 2019-05-18 LAB — NOVEL CORONAVIRUS, NAA: SARS-CoV-2, NAA: NOT DETECTED

## 2019-12-06 DIAGNOSIS — Z20828 Contact with and (suspected) exposure to other viral communicable diseases: Secondary | ICD-10-CM | POA: Diagnosis not present

## 2020-02-19 DIAGNOSIS — Z01419 Encounter for gynecological examination (general) (routine) without abnormal findings: Secondary | ICD-10-CM | POA: Diagnosis not present

## 2020-02-19 DIAGNOSIS — Z124 Encounter for screening for malignant neoplasm of cervix: Secondary | ICD-10-CM | POA: Diagnosis not present

## 2020-02-19 DIAGNOSIS — Z114 Encounter for screening for human immunodeficiency virus [HIV]: Secondary | ICD-10-CM | POA: Diagnosis not present

## 2020-02-19 DIAGNOSIS — Z3202 Encounter for pregnancy test, result negative: Secondary | ICD-10-CM | POA: Diagnosis not present

## 2020-02-19 DIAGNOSIS — Z113 Encounter for screening for infections with a predominantly sexual mode of transmission: Secondary | ICD-10-CM | POA: Diagnosis not present

## 2020-04-05 ENCOUNTER — Other Ambulatory Visit: Payer: Medicaid Other

## 2020-04-05 ENCOUNTER — Other Ambulatory Visit: Payer: Self-pay | Admitting: *Deleted

## 2020-04-05 DIAGNOSIS — Z20822 Contact with and (suspected) exposure to covid-19: Secondary | ICD-10-CM | POA: Diagnosis not present

## 2020-04-06 LAB — NOVEL CORONAVIRUS, NAA: SARS-CoV-2, NAA: NOT DETECTED

## 2020-04-06 LAB — SARS-COV-2, NAA 2 DAY TAT

## 2020-06-23 DIAGNOSIS — Z419 Encounter for procedure for purposes other than remedying health state, unspecified: Secondary | ICD-10-CM | POA: Diagnosis not present

## 2020-07-24 DIAGNOSIS — Z419 Encounter for procedure for purposes other than remedying health state, unspecified: Secondary | ICD-10-CM | POA: Diagnosis not present

## 2020-07-24 NOTE — L&D Delivery Note (Signed)
Delivery Note At 12:38 AM a viable and healthy female was delivered via Vaginal, Spontaneous (Presentation: Right Occiput Anterior).  APGAR: 8, 9; weight 6 lb 12.1 oz (3065 g).   Placenta status: Spontaneous, Intact.  Cord: 3 vessels with the following complications:  none.    Anesthesia: Epidural Episiotomy: None Lacerations: None Suture Repair:  n/a Est. Blood Loss (mL): 50  Mom to postpartum.  Baby to Couplet care / Skin to Skin.   Laurie Cruz is a 30 y.o. female [phone] with IUP at [redacted]w[redacted]d admitted for PROM.  She progressed with augmentation to complete and pushed less than 5 minutes to deliver.  Cord clamping delayed by 1-3 minutes then clamped by CNM and cut by father of the baby.  Placenta intact and spontaneous, bleeding minimal.  Intact perineum, no repair Patient and baby stable prior to transfer to postpartum. She plans on breastfeeding. She requests PP IUD for birth control. See separate procedure note.  Misty Stanley Leftwich-Kirby 01/22/2021, 3:03 AM

## 2020-07-29 ENCOUNTER — Telehealth (INDEPENDENT_AMBULATORY_CARE_PROVIDER_SITE_OTHER): Payer: Self-pay

## 2020-07-29 DIAGNOSIS — Z3480 Encounter for supervision of other normal pregnancy, unspecified trimester: Secondary | ICD-10-CM

## 2020-07-29 NOTE — Progress Notes (Signed)
New OB Intake  I connected with  Gaye Alken on 07/29/20 at  3:15 PM EST by telephone and verified that I am speaking with the correct person using two identifiers. Nurse is located at [facility] and pt is located at home.  I discussed the limitations, risks, security and privacy concerns of performing an evaluation and management service by telephone and the availability of in person appointments. I also discussed with the patient that there may be a patient responsible charge related to this service. The patient expressed understanding and agreed to proceed.  I explained I am completing New OB Intake today. We discussed her EDD of 01/21/21 that is based on LMP of 04/16/20. Pt is G2/P0. I reviewed her allergies, medications, Medical/Surgical/OB history, and appropriate screenings. I informed her of [facility] services. Based on history, this is a/an uncomplicated pregnancy.  Concerns addressed today  MyChart/Babyscripts MyChart access verified. I explained pt will have some visits in office and some virtually. Babyscripts instructions given. Account successfully created and app downloaded.  Blood Pressure Cuff Blood pressure cuff ordered for patient to pick-up from [employer]. Explained after first prenatal appt pt will check weekly and document in Babyscripts.  Anatomy US Explained first scheduled [place] will be around 19 weeks. Anatomy US scheduled for 08/27/20 at 1:45p.   Labs Discussed Avelina Laine genetic screening with patient. Would like both Panorama and Horizon drawn at new OB visit. Routine prenatal labs needed.  First visit review I reviewed new OB appt with pt. I explained she will have a pelvic exam, ob bloodwork with genetic screening, and PAP smear. Explained pt will be seen by Monna Fam at first visit; encounter routed to appropriate provider.  Henrietta Dine, CMA 07/29/2020  3:30 PM

## 2020-08-05 ENCOUNTER — Other Ambulatory Visit: Payer: Self-pay

## 2020-08-05 ENCOUNTER — Encounter: Payer: Self-pay | Admitting: Certified Nurse Midwife

## 2020-08-05 ENCOUNTER — Other Ambulatory Visit (HOSPITAL_COMMUNITY)
Admission: RE | Admit: 2020-08-05 | Discharge: 2020-08-05 | Disposition: A | Discharge status: Home / Self Care | Payer: Medicaid Other | Source: Ambulatory Visit | Attending: Certified Nurse Midwife | Admitting: Certified Nurse Midwife

## 2020-08-05 ENCOUNTER — Ambulatory Visit (INDEPENDENT_AMBULATORY_CARE_PROVIDER_SITE_OTHER): Payer: Self-pay | Admitting: Certified Nurse Midwife

## 2020-08-05 VITALS — BP 110/76 | HR 94 | Wt 224.4 lb

## 2020-08-05 DIAGNOSIS — Z3480 Encounter for supervision of other normal pregnancy, unspecified trimester: Secondary | ICD-10-CM

## 2020-08-05 DIAGNOSIS — Z3A15 15 weeks gestation of pregnancy: Secondary | ICD-10-CM

## 2020-08-05 LAB — POCT URINALYSIS DIP (DEVICE)
Bilirubin Urine: NEGATIVE
Glucose, UA: NEGATIVE mg/dL
Hgb urine dipstick: NEGATIVE
Leukocytes,Ua: NEGATIVE
Nitrite: NEGATIVE
Protein, ur: NEGATIVE mg/dL
Specific Gravity, Urine: >=1.03 (ref 1.005–1.030)
Urobilinogen, UA: 0.2 mg/dL (ref 0.0–1.0)
pH: 6 (ref 5.0–8.0)

## 2020-08-05 MED ORDER — PNV PRENATAL PLUS MULTIVITAMIN 27-1 MG PO TABS: 1.0000 | ORAL_TABLET | Freq: Every day | ORAL | 11 refills | Status: AC

## 2020-08-05 MED ORDER — BLOOD PRESSURE KIT DEVI: 1.0000 | Freq: Once | 0 refills | Status: AC

## 2020-08-06 LAB — CBC/D/PLT+RPR+RH+ABO+RUB AB...
Antibody Screen: NEGATIVE
Basophils Absolute: 0 10*3/uL (ref 0.0–0.2)
Basos: 0 %
EOS (ABSOLUTE): 0.1 10*3/uL (ref 0.0–0.4)
Eos: 1 %
HCV Ab: <0.1 s/co ratio (ref 0.0–0.9)
HIV Screen 4th Generation wRfx: NONREACTIVE
Hematocrit: 35.4 % (ref 34.0–46.6)
Hemoglobin: 12.3 g/dL (ref 11.1–15.9)
Hepatitis B Surface Ag: NEGATIVE
Immature Grans (Abs): 0.1 10*3/uL (ref 0.0–0.1)
Immature Granulocytes: 1 %
Lymphocytes Absolute: 2.8 10*3/uL (ref 0.7–3.1)
Lymphs: 25 %
MCH: 31.6 pg (ref 26.6–33.0)
MCHC: 34.7 g/dL (ref 31.5–35.7)
MCV: 91 fL (ref 79–97)
Monocytes Absolute: 0.5 10*3/uL (ref 0.1–0.9)
Monocytes: 4 %
Neutrophils Absolute: 7.4 10*3/uL — ABNORMAL HIGH (ref 1.4–7.0)
Neutrophils: 69 %
Platelets: 351 10*3/uL (ref 150–450)
RBC: 3.89 x10E6/uL (ref 3.77–5.28)
RDW: 12 % (ref 11.7–15.4)
RPR Ser Ql: NONREACTIVE
Rh Factor: POSITIVE
Rubella Antibodies, IGG: 7.43 index (ref >0.99)
WBC: 10.8 10*3/uL (ref 3.4–10.8)

## 2020-08-06 LAB — HCV INTERPRETATION

## 2020-08-06 NOTE — Progress Notes (Signed)
History:   Laurie Cruz is a 30 y.o. G4P1011 at 74w6dby LMP being seen today for her first obstetrical visit.  Her obstetrical history is not significant. Patient plans to bottle feed and would like an IUD for postpartum contraception. Pregnancy history fully reviewed - patient has been struggling with nausea and lack of appetite, both are starting to improve. She does not like taking prescription or OTC drugs for mild complaints.  HISTORY: OB History  Gravida Para Term Preterm AB Living  _0 0 1 1  SAB IAB Ectopic Multiple Live Births  0 1 0 0 1    # Outcome Date GA Lbr Len/2nd Weight Sex Delivery Anes PTL Lv  4 Current           3 Gravida 10/29/13    M   N LIV  2 Term           1 IAB             Last pap smear was done 2021 and was abnormal - but she did not follow up on her results.  Past Medical History:  Diagnosis Date  . Asthma    as a child   Past Surgical History:  Procedure Laterality Date  . NO PAST SURGERIES    . ORIF ANKLE FRACTURE Right 02/23/2017   Procedure: OPEN REDUCTION INTERNAL FIXATION (ORIF) RIGHT TRIMALLEOLAR  FRACTURE;  Surgeon: HAltamese Sycamore MD;  [place] [facility]  Service: Orthopedics;  Laterality: Right;   Family History  Problem Relation Age of Onset  . Anemia Mother   . Diabetes Father    Social History   Tobacco Use  . Smoking status: Never Smoker  . Smokeless tobacco: Never Used  Vaping Use  . Vaping Use: Never used  Substance Use Topics  . Alcohol use: Yes    Comment: social  . Drug use: Yes    Types: Marijuana    Comment: today 02/22/17   No Known Allergies No current outpatient medications on file prior to visit.   No current facility-administered medications on file prior to visit.    Review of Systems Pertinent items noted in HPI and remainder of comprehensive ROS otherwise negative. Physical Exam:   Vitals:   08/05/20 1540  BP: 110/76  Pulse: 94  Weight: 224 lb 6.4 oz (101.8 kg)   Fetal Heart Rate (bpm):  148  Uterus:  Fundal Height: 15 cm  Pelvic Exam: Perineum: no hemorrhoids, normal perineum   Vulva: normal external genitalia, no lesions   Vagina:  normal mucosa, normal discharge   Cervix: no lesions and normal, pap smear done.    Adnexa: normal adnexa and no mass, fullness, tenderness   Bony Pelvis: average  System: General: well-developed, well-nourished female in no acute distress   Breasts:  normal appearance, no masses or tenderness bilaterally   Skin: normal coloration and turgor, no rashes   Neurologic: oriented, normal, negative, normal mood   Extremities: normal strength, tone, and muscle mass, ROM of all joints is normal   HEENT PERRLA, extraocular movement intact and sclera clear, anicteric   Mouth/Teeth mucous membranes moist, pharynx normal without lesions and dental hygiene good   Neck supple and no masses   Cardiovascular: regular rate and rhythm   Respiratory:  no respiratory distress, normal breath sounds   Abdomen: soft, non-tender; bowel sounds normal; no masses,  no organomegaly    Assessment:    Pregnancy: [id number] Active Problem List   Diagnosis Date  Noted  . Supervision of other normal pregnancy, antepartum 07/29/2020  . Asthma   . 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 Closed displaced trimalleolar fracture of right lower leg 02/16/2017     Plan:    1. Supervision of other normal pregnancy, antepartum - Blood Pressure Monitoring (BLOOD PRESSURE KIT) DEVI; 1 Device by Does not apply route once for 1 dose. (Patient not taking: Reported on 08/05/2020)  Dispense: 1 each; Refill: 0 - Prenatal Vit-Fe Fumarate-FA (PNV PRENATAL PLUS MULTIVITAMIN) 27-1 MG TABS; Take 1 tablet by mouth daily.  Dispense: 30 tablet; Refill: 11 - CBC/D/Plt+RPR+Rh+ABO+Rub Ab... - Genetic Screening - Culture, OB Urine - Cytology - PAP( [place])  2. [redacted] weeks gestation of pregnancy - Problem list reviewed and updated. - Genetic Screening discussed,  First trimester screen, Quad screen and NIPS: ordered. - Ultrasound discussed; fetal anatomic survey: ordered. - Anticipatory guidance about prenatal visits given including labs, ultrasounds, and testing. - Discussed usage of Babyscripts and virtual visits as additional source of managing and completing prenatal visits in midst of coronavirus and pandemic.   - Encouraged to complete MyChart Registration for her ability to review results, send requests, and have questions addressed.  - The nature of [place] for [facility] Healthcare/Faculty Practice with multiple MDs and Advanced Practice Providers was explained to patient; also emphasized that residents, students are part of our team. - Routine obstetric precautions reviewed. Encouraged to seek out care at office or emergency room [facility] preferred) for urgent and/or emergent concerns. Return in about 4 weeks (around 09/02/2020) for LOB.     Gaylan Gerold, MSN, CNM, [place] Certified Nurse Midwife, [facility]

## 2020-08-07 LAB — URINE CULTURE, OB REFLEX

## 2020-08-07 LAB — CULTURE, OB URINE

## 2020-08-09 LAB — CYTOLOGY - PAP
Chlamydia: NEGATIVE
Comment: NEGATIVE
Comment: NEGATIVE
Comment: NEGATIVE
Comment: NORMAL
Diagnosis: NEGATIVE
High risk HPV: NEGATIVE
Neisseria Gonorrhea: NEGATIVE
Trichomonas: NEGATIVE

## 2020-08-11 ENCOUNTER — Encounter: Payer: Self-pay | Admitting: *Deleted

## 2020-08-24 DIAGNOSIS — Z419 Encounter for procedure for purposes other than remedying health state, unspecified: Secondary | ICD-10-CM | POA: Diagnosis not present

## 2020-08-27 ENCOUNTER — Ambulatory Visit: Payer: Medicaid Other | Attending: Certified Nurse Midwife

## 2020-08-27 ENCOUNTER — Other Ambulatory Visit: Payer: Self-pay

## 2020-08-27 DIAGNOSIS — Z3480 Encounter for supervision of other normal pregnancy, unspecified trimester: Secondary | ICD-10-CM | POA: Insufficient documentation

## 2020-09-02 ENCOUNTER — Telehealth (INDEPENDENT_AMBULATORY_CARE_PROVIDER_SITE_OTHER): Payer: Medicaid Other | Admitting: Certified Nurse Midwife

## 2020-09-02 DIAGNOSIS — O99512 Diseases of the respiratory system complicating pregnancy, second trimester: Secondary | ICD-10-CM

## 2020-09-02 DIAGNOSIS — J45909 Unspecified asthma, uncomplicated: Secondary | ICD-10-CM

## 2020-09-02 DIAGNOSIS — Z3A19 19 weeks gestation of pregnancy: Secondary | ICD-10-CM

## 2020-09-02 DIAGNOSIS — Z3480 Encounter for supervision of other normal pregnancy, unspecified trimester: Secondary | ICD-10-CM

## 2020-09-02 NOTE — Progress Notes (Signed)
I connected with  Laurie Cruz on 09/02/20 at 1545 by MyChart and verified that I am speaking with the correct person using two identifiers.   I discussed the limitations, risks, security and privacy concerns of performing an evaluation and management service by telephone and the availability of in person appointments. I also discussed with the patient that there may be a patient responsible charge related to this service. The patient expressed understanding and agreed to proceed.  Called [facility] to schedule delivery of BP cuff for tomorrow.   Marjo Bicker, RN 09/02/2020  3:45 PM

## 2020-09-02 NOTE — Patient Instructions (Signed)

## 2020-09-03 ENCOUNTER — Encounter: Payer: Self-pay | Admitting: Certified Nurse Midwife

## 2020-09-03 NOTE — Progress Notes (Signed)
OBSTETRICS PRENATAL VIRTUAL VISIT ENCOUNTER NOTE  Provider location: [facility] for [facility]   I connected with Laurie Cruz on 09/03/20 at  3:35 PM EST by MyChart Video Encounter at home and verified that I am speaking with the correct person using two identifiers.   I discussed the limitations, risks, security and privacy concerns of performing an evaluation and management service virtually and the availability of in person appointments. I also discussed with the patient that there may be a patient responsible charge related to this service. The patient expressed understanding and agreed to proceed. Subjective:  Laurie Cruz is a 30 y.o. G4P1011 at [redacted]w[redacted]d being seen today for ongoing prenatal care.  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  Patient reports no complaints.  Contractions: Not present. Vag. Bleeding: None.  Movement: Absent. Denies any leaking of fluid.   The following portions of the patient's history were reviewed and updated as appropriate: allergies, current medications, past family history, past medical history, past social history, past surgical history and problem list.   Objective:  There were no vitals filed for this visit.  Fetal Status:     Movement: Absent     General:  Alert, oriented and cooperative. Patient is in no acute distress.  Respiratory: Normal respiratory effort, no problems with respiration noted  Mental Status: Normal mood and affect. Normal behavior. Normal judgment and thought content.  Rest of physical exam deferred due to type of encounter  Imaging: [place] MFM OB COMP + 14 WK  Result Date: 08/27/2020 ----------------------------------------------------------------------   OBSTETRICS REPORT                       (Signed Final 08/27/2020 05:14 pm) ---------------------------------------------------------------------- Patient Info  ID #:       [id number]                          D.O.B.:  February 17, 1991 (29 yrs)  Name:       Laurie Cruz              Visit Date: 08/27/2020 02:40 pm ---------------------------------------------------------------------- Performed By  Attending:        Noralee Space MD        Ref. Address:     [facility] for                                                             [facility]                                                             Healthcare  Performed By:     Ceasar Lund        [facility]  Referred By:      Bernerd Limbo CNM ---------------------------------------------------------------------- Orders  #  Description                           Code        Ordered By  1  [place] MFM OB COMP + 14 WK                76805.01    Hedaya Latendresse ----------------------------------------------------------------------  #  Order #                     Accession #                Episode #  1  [id number]                   [id number]                 [id number] ---------------------------------------------------------------------- Indications  [redacted] weeks gestation of pregnancy                Z3A.19  Obesity complicating pregnancy, second         O99.212  trimester ---------------------------------------------------------------------- Fetal Evaluation  Num Of Fetuses:         1  Fetal Heart Rate(bpm):  132  Cardiac Activity:       Observed  Presentation:           Cephalic  Placenta:               Anterior  P. Cord Insertion:      Visualized, central  Amniotic Fluid  AFI FV:      Within normal limits  ---------------------------------------------------------------------- Biometry  BPD:        47  mm     G. Age:  20w 1d         91  %    CI:        76.22   %    70 - 86                                                          FL/HC:      16.6   %    16.1 - 18.3  HC:      170.6  mm     G. Age:  19w 5d         74  %  HC/AC:      1.25        1.09 - 1.39  AC:      136.5  mm     G. Age:  19w 1d         49  %    FL/BPD:     60.2   %  FL:       28.3  mm     G. Age:  18w 5d         31  %    FL/AC:      20.7   %    20 - 24  HUM:      27.6  mm     G. Age:  18w 6d         46  %  CER:        20  mm     G. Age:  19w 2d         50  %  NFT:       3.7  mm  LV:        4.5  mm  CM:        4.9  mm  Est. FW:     269  gm      0 lb 9 oz     46  % ---------------------------------------------------------------------- Gestational Age  LMP:           19w 0d        Date:  04/16/20                 EDD:   01/21/21  U/S Today:     19w 3d                                        EDD:   01/18/21  Best:          19w 0d     Det. By:  LMP  (04/16/20)          EDD:   01/21/21 ---------------------------------------------------------------------- Anatomy  Cranium:               Appears normal         LVOT:                   Appears normal  Cavum:                 Appears normal         Aortic Arch:            Appears normal  Ventricles:            Appears normal         Ductal Arch:            Appears normal  Choroid Plexus:        Appears normal         Diaphragm:              Appears normal  Cerebellum:            Appears normal         Stomach:                Appears normal, left  sided  Posterior Fossa:       Appears normal         Abdomen:                Appears normal  Nuchal Fold:           Appears normal         Abdominal Wall:         Appears nml (cord                                                                        insert, abd wall)  Face:                  Appears  normal         Cord Vessels:           Appears normal (3                         (orbits and profile)                           vessel cord)  Lips:                  Appears normal         Kidneys:                Appear normal  Palate:                Appears normal         Bladder:                Appears normal  Thoracic:              Appears normal         Spine:                  Appears normal  Heart:                 Appears normal         Upper Extremities:      Appears normal                         (4CH, axis, and                         situs)  RVOT:                  Appears normal         Lower Extremities:      Appears normal  Other:  SVC IVC appears normal. 3VV/T appears normal/ Nasal bone          visualized. Heels and 5th digit visualized. Fetus appears to be female. ---------------------------------------------------------------------- Cervix Uterus Adnexa  Cervix  Length:           4.04  cm.  Normal appearance by transabdominal scan.  Right Ovary  Appears normal  Left Ovary  Appears normal  Adnexa  No abnormality visualized. ---------------------------------------------------------------------- Impression  G4 P1.  Patient is here for fetal  anatomy scan.  On cell-free fetal DNA screening, the risks of fetal  aneuploidies are not increased .  Obstetric history significant for a term vaginal delivery.  We performed fetal anatomy scan. No makers of  aneuploidies or fetal structural defects are seen. Fetal  biometry is consistent with her previously-established dates.  Amniotic fluid is normal and good fetal activity is seen.  Patient understands the limitations of ultrasound in detecting  fetal anomalies. ---------------------------------------------------------------------- Recommendations  Recommend fetal growth assessment at [redacted] weeks gestation. ----------------------------------------------------------------------                  Noralee Space, MD Electronically Signed Final Report   08/27/2020 05:14 pm  ----------------------------------------------------------------------   Assessment and Plan:  Pregnancy: G4P1011 at [redacted]w[redacted]d 1. Supervision of other normal pregnancy, antepartum - Pt doing well, beginning to feel regular fetal movement  2. [redacted] weeks gestation of pregnancy - Discussed reason for additional growth scan at 32 weeks. Indication is for "maternal obesity". Advised that obesity alone according to the BMI is not what puts her at risk and discussed health strategies to decrease risks to include stress reduction, regular exercise, balanced protein rich diet, and hydration. Pt relieved and amenable to lifestyle modification and growth scans. - Anticipatory guidance given regarding next visits  Preterm labor symptoms and general obstetric precautions including but not limited to vaginal bleeding, contractions, leaking of fluid and fetal movement were reviewed in detail with the patient. I discussed the assessment and treatment plan with the patient. The patient was provided an opportunity to ask questions and all were answered. The patient agreed with the plan and demonstrated an understanding of the instructions. The patient was advised to call back or seek an in-person office evaluation/go to [facility]U at [facility] for any urgent or concerning symptoms. Please refer to After Visit Summary for other counseling recommendations.   I provided 15 minutes of face-to-face time during this encounter.  Return in about 4 weeks (around 09/30/2020) for IN-PERSON, LOB.  Edd Arbour, CNM, MSN, IBCLC Certified Nurse Midwife, [facility]

## 2020-09-17 ENCOUNTER — Encounter: Payer: Self-pay | Admitting: *Deleted

## 2020-09-21 DIAGNOSIS — Z419 Encounter for procedure for purposes other than remedying health state, unspecified: Secondary | ICD-10-CM | POA: Diagnosis not present

## 2020-09-24 ENCOUNTER — Encounter: Payer: Self-pay | Admitting: *Deleted

## 2020-10-04 ENCOUNTER — Ambulatory Visit (INDEPENDENT_AMBULATORY_CARE_PROVIDER_SITE_OTHER): Payer: Medicaid Other | Admitting: Obstetrics & Gynecology

## 2020-10-04 ENCOUNTER — Other Ambulatory Visit: Payer: Self-pay

## 2020-10-04 VITALS — BP 107/73 | HR 91 | Wt 229.2 lb

## 2020-10-04 DIAGNOSIS — Z3480 Encounter for supervision of other normal pregnancy, unspecified trimester: Secondary | ICD-10-CM

## 2020-10-04 DIAGNOSIS — O99210 Obesity complicating pregnancy, unspecified trimester: Secondary | ICD-10-CM

## 2020-10-04 NOTE — Progress Notes (Addendum)
Subjective:  Laurie Cruz is a 30 y.o. G4P1011 at [redacted]w[redacted]d being seen today for prenatal care.  Patient reports no complaints.  Contractions: Not present. Movement: Present. Denies leaking of fluid.   The following portions of the patient's history were reviewed and updated as appropriate: allergies, current medications, past family history, past medical history, past social history, past surgical history and problem list.   Objective:   Vitals:   10/04/20 1532  BP: 107/73  Pulse: 91  Weight: 229 lb 3.2 oz (104 kg)    Fetal Status: Fetal Heart Rate (bpm): 155   Movement: Present     General:  Alert, oriented and cooperative. Patient is in no acute distress.  Skin: Skin is warm and dry. No rash noted.   Cardiovascular: Normal heart rate noted  Respiratory: Normal respiratory effort, no problems with respiration noted  Abdomen: Soft, gravid, appropriate for gestational age. Pain/Pressure: Absent     Vaginal:  .       Cervix: Not evaluated        Extremities: Normal range of motion.  Edema: None  Mental Status: Normal mood and affect. Normal behavior. Normal judgment and thought content.   Urinalysis:      Assessment and Plan:  Pregnancy: G4P1011 at [redacted]w[redacted]d  1. Supervision of other normal pregnancy, antepartum Normal growth on previous ultrasound,  - [place] MFM OB FOLLOW UP for growth at 32 weeks  2. Obesity affecting pregnancy, antepartum Body mass index is 36.99 kg/m.   Preterm labor symptoms and general obstetric precautions including but not limited to vaginal bleeding, contractions, leaking of fluid and fetal movement were reviewed in detail with the patient. Please refer to After Visit Summary for other counseling recommendations.  Return in about 4 weeks (around 11/01/2020) for 2 hr GTT.   Lendon Ka, Medical Student  Attestation of Attending Supervision of Medical Student: Evaluation and management procedures were performed by the medical student under my  supervision and collaboration.  I have reviewed the student's note and chart, and I agree with the management and plan.  Scheryl Darter, MD, FACOG Attending Obstetrician & Gynecologist Faculty Practice, [facility]

## 2020-10-04 NOTE — Patient Instructions (Signed)

## 2020-10-22 DIAGNOSIS — Z419 Encounter for procedure for purposes other than remedying health state, unspecified: Secondary | ICD-10-CM | POA: Diagnosis not present

## 2020-10-25 ENCOUNTER — Other Ambulatory Visit: Payer: Self-pay | Admitting: Lactation Services

## 2020-10-25 DIAGNOSIS — Z3480 Encounter for supervision of other normal pregnancy, unspecified trimester: Secondary | ICD-10-CM

## 2020-11-01 ENCOUNTER — Other Ambulatory Visit: Payer: Medicaid Other

## 2020-11-01 ENCOUNTER — Other Ambulatory Visit: Payer: Self-pay

## 2020-11-01 ENCOUNTER — Encounter: Payer: Self-pay | Admitting: Obstetrics and Gynecology

## 2020-11-01 ENCOUNTER — Ambulatory Visit (INDEPENDENT_AMBULATORY_CARE_PROVIDER_SITE_OTHER): Payer: Medicaid Other | Admitting: Obstetrics and Gynecology

## 2020-11-01 VITALS — BP 120/81 | HR 111 | Wt 231.3 lb

## 2020-11-01 DIAGNOSIS — Z23 Encounter for immunization: Secondary | ICD-10-CM

## 2020-11-01 DIAGNOSIS — Z3480 Encounter for supervision of other normal pregnancy, unspecified trimester: Secondary | ICD-10-CM

## 2020-11-01 NOTE — Progress Notes (Signed)
   PRENATAL VISIT NOTE  Subjective:  Laurie Cruz is a 30 y.o. G4P1011 at [redacted]w[redacted]d being seen today for ongoing prenatal care.  She is currently monitored for the following issues for this low-risk pregnancy and has Asthma and Supervision of other normal pregnancy, antepartum on their problem list.  Patient reports no complaints.  Contractions: Not present. Vag. Bleeding: None.  Movement: Present. Denies leaking of fluid.   The following portions of the patient's history were reviewed and updated as appropriate: allergies, current medications, past family history, past medical history, past social history, past surgical history and problem list.   Objective:   Vitals:   11/01/20 0843  BP: 120/81  Pulse: (!) 111  Weight: 231 lb 4.8 oz (104.9 kg)    Fetal Status: Fetal Heart Rate (bpm): 140 Fundal Height: 28 cm Movement: Present     General:  Alert, oriented and cooperative. Patient is in no acute distress.  Skin: Skin is warm and dry. No rash noted.   Cardiovascular: Normal heart rate noted  Respiratory: Normal respiratory effort, no problems with respiration noted  Abdomen: Soft, gravid, appropriate for gestational age.  Pain/Pressure: Present     Pelvic: Cervical exam deferred        Extremities: Normal range of motion.  Edema: None  Mental Status: Normal mood and affect. Normal behavior. Normal judgment and thought content.   Assessment and Plan:  Pregnancy: G4P1011 at [redacted]w[redacted]d  1. Supervision of other normal pregnancy, antepartum  - Tdap vaccine greater than or equal to 7yo IM - Ate last night not knowing she was supposed to do 2 hour GTT today. She is coming back tomorrow for 2 hour GTT.    Preterm labor symptoms and general obstetric precautions including but not limited to vaginal bleeding, contractions, leaking of fluid and fetal movement were reviewed in detail with the patient. Please refer to After Visit Summary for other counseling recommendations.   No  follow-ups on file.  Future Appointments  Date Time Provider Department Center  11/02/2020  8:40 AM [facility]  11/22/2020  3:00 PM [facility] US1 [facility]    Venia Carbon, NP

## 2020-11-01 NOTE — Progress Notes (Signed)
Here for 28 week ob visit. States ate breakfast an hour ago. Reschedule glucose test for tomorrow. Laurie Bonafede,RN

## 2020-11-02 ENCOUNTER — Other Ambulatory Visit: Payer: Medicaid Other

## 2020-11-02 DIAGNOSIS — Z3480 Encounter for supervision of other normal pregnancy, unspecified trimester: Secondary | ICD-10-CM | POA: Diagnosis not present

## 2020-11-03 LAB — GLUCOSE TOLERANCE, 2 HOURS W/ 1HR
Glucose, 1 hour: 136 mg/dL (ref 65–179)
Glucose, 2 hour: 140 mg/dL (ref 65–152)
Glucose, Fasting: 89 mg/dL (ref 65–91)

## 2020-11-03 LAB — CBC
Hematocrit: 35.1 % (ref 34.0–46.6)
Hemoglobin: 11.6 g/dL (ref 11.1–15.9)
MCH: 30.4 pg (ref 26.6–33.0)
MCHC: 33 g/dL (ref 31.5–35.7)
MCV: 92 fL (ref 79–97)
Platelets: 307 10*3/uL (ref 150–450)
RBC: 3.81 x10E6/uL (ref 3.77–5.28)
RDW: 11.5 % — ABNORMAL LOW (ref 11.7–15.4)
WBC: 11.4 10*3/uL — ABNORMAL HIGH (ref 3.4–10.8)

## 2020-11-03 LAB — RPR: RPR Ser Ql: NONREACTIVE

## 2020-11-03 LAB — HIV ANTIBODY (ROUTINE TESTING W REFLEX): HIV Screen 4th Generation wRfx: NONREACTIVE

## 2020-11-16 ENCOUNTER — Other Ambulatory Visit: Payer: Self-pay

## 2020-11-16 ENCOUNTER — Ambulatory Visit (INDEPENDENT_AMBULATORY_CARE_PROVIDER_SITE_OTHER): Payer: Medicaid Other | Admitting: Advanced Practice Midwife

## 2020-11-16 VITALS — BP 102/70 | HR 113 | Wt 239.6 lb

## 2020-11-16 DIAGNOSIS — Z3A30 30 weeks gestation of pregnancy: Secondary | ICD-10-CM

## 2020-11-16 DIAGNOSIS — Z3480 Encounter for supervision of other normal pregnancy, unspecified trimester: Secondary | ICD-10-CM

## 2020-11-16 DIAGNOSIS — O99210 Obesity complicating pregnancy, unspecified trimester: Secondary | ICD-10-CM

## 2020-11-16 NOTE — Patient Instructions (Signed)
Fetal Movement Counts Patient Name: ________________________________________________ Patient Due Date: ____________________  What is a fetal movement count? A fetal movement count is the number of times that you feel your baby move during a certain amount of time. This may also be called a fetal kick count. A fetal movement count is recommended for every pregnant woman. You may be asked to start counting fetal movements as early as week 28 of your pregnancy. Pay attention to when your baby is most active. You may notice your baby's sleep and wake cycles. You may also notice things that make your baby move more. You should do a fetal movement count:  When your baby is normally most active.  At the same time each day. A good time to count movements is while you are resting, after having something to eat and drink. How do I count fetal movements? 1. Find a quiet, comfortable area. Sit, or lie down on your side. 2. Write down the date, the start time and stop time, and the number of movements that you felt between those two times. Take this information with you to your health care visits. 3. Write down your start time when you feel the first movement. 4. Count kicks, flutters, swishes, rolls, and jabs. You should feel at least 10 movements. 5. You may stop counting after you have felt 10 movements, or if you have been counting for 2 hours. Write down the stop time. 6. If you do not feel 10 movements in 2 hours, contact your health care provider for further instructions. Your health care provider may want to do additional tests to assess your baby's well-being. Contact a health care provider if:  You feel fewer than 10 movements in 2 hours.  Your baby is not moving like he or she usually does. Date: ____________ Start time: ____________ Stop time: ____________ Movements: ____________ Date: ____________ Start time: ____________ Stop time: ____________ Movements: ____________ Date: ____________  Start time: ____________ Stop time: ____________ Movements: ____________ Date: ____________ Start time: ____________ Stop time: ____________ Movements: ____________ Date: ____________ Start time: ____________ Stop time: ____________ Movements: ____________ Date: ____________ Start time: ____________ Stop time: ____________ Movements: ____________ Date: ____________ Start time: ____________ Stop time: ____________ Movements: ____________ Date: ____________ Start time: ____________ Stop time: ____________ Movements: ____________ Date: ____________ Start time: ____________ Stop time: ____________ Movements: ____________ This information is not intended to replace advice given to you by your health care provider. Make sure you discuss any questions you have with your health care provider. Document Revised: 02/27/2019 Document Reviewed: 02/27/2019 Elsevier Patient Education  2021 Elsevier Inc.  

## 2020-11-16 NOTE — Progress Notes (Signed)
   PRENATAL VISIT NOTE  Subjective:  Laurie Cruz is a 30 y.o. G4P1011 at 106w4d being seen today for ongoing prenatal care.  She is currently monitored for the following issues for this low-risk pregnancy and has Asthma and Supervision of other normal pregnancy, antepartum on their problem list.  Patient reports no complaints.  Contractions: Not present. Vag. Bleeding: None.  Movement: Present. Denies leaking of fluid.   The following portions of the patient's history were reviewed and updated as appropriate: allergies, current medications, past family history, past medical history, past social history, past surgical history and problem list. Problem list updated.  Objective:   Vitals:   11/16/20 1602 11/16/20 1611  BP: 102/70   Pulse: (!) 122 (!) 113  Weight: 239 lb 9.6 oz (108.7 kg)     Fetal Status: Fetal Heart Rate (bpm): 150 Fundal Height: 30 cm Movement: Present     General:  Alert, oriented and cooperative. Patient is in no acute distress.  Skin: Skin is warm and dry. No rash noted.   Cardiovascular: Normal heart rate noted  Respiratory: Normal respiratory effort, no problems with respiration noted  Abdomen: Soft, gravid, appropriate for gestational age.  Pain/Pressure: Present     Pelvic: Cervical exam deferred        Extremities: Normal range of motion.  Edema: None  Mental Status: Normal mood and affect. Normal behavior. Normal judgment and thought content.   Assessment and Plan:  Pregnancy: G4P1011 at [redacted]w[redacted]d  1. Supervision of other normal pregnancy, antepartum - Routine care - Advised to start daily kick counts, reviewed interventions for low kick number, indications for triage in MAU  2. [redacted] weeks gestation of pregnancy   3. Obesity affecting pregnancy, antepartum - Next growth scan scheduled for next week  Preterm labor symptoms and general obstetric precautions including but not limited to vaginal bleeding, contractions, leaking of fluid and fetal  movement were reviewed in detail with the patient. Please refer to After Visit Summary for other counseling recommendations.  Return in about 2 weeks (around 11/30/2020) for Prefers midwives plz.  Future Appointments  Date Time Provider Department Center  11/22/2020  3:00 PM [facility] US1 [facility]  12/03/2020 10:55 AM Burleson, Brand Males, NP [facility]    Calvert Cantor, CNM

## 2020-11-21 DIAGNOSIS — Z419 Encounter for procedure for purposes other than remedying health state, unspecified: Secondary | ICD-10-CM | POA: Diagnosis not present

## 2020-11-22 ENCOUNTER — Other Ambulatory Visit: Payer: Self-pay

## 2020-11-22 ENCOUNTER — Ambulatory Visit: Payer: Medicaid Other | Attending: Obstetrics & Gynecology

## 2020-11-22 DIAGNOSIS — Z3480 Encounter for supervision of other normal pregnancy, unspecified trimester: Secondary | ICD-10-CM | POA: Insufficient documentation

## 2020-12-03 ENCOUNTER — Other Ambulatory Visit: Payer: Self-pay

## 2020-12-03 ENCOUNTER — Ambulatory Visit (INDEPENDENT_AMBULATORY_CARE_PROVIDER_SITE_OTHER): Payer: Medicaid Other | Admitting: Nurse Practitioner

## 2020-12-03 VITALS — BP 118/79 | HR 106 | Wt 241.9 lb

## 2020-12-03 DIAGNOSIS — Z3A33 33 weeks gestation of pregnancy: Secondary | ICD-10-CM

## 2020-12-03 DIAGNOSIS — Z3480 Encounter for supervision of other normal pregnancy, unspecified trimester: Secondary | ICD-10-CM

## 2020-12-03 NOTE — Progress Notes (Signed)
    Subjective:  Laurie Cruz is a 30 y.o. G4P1011 at [redacted]w[redacted]d being seen today for ongoing prenatal care.  She is currently monitored for the following issues for this low-risk pregnancy and has Asthma and Supervision of other normal pregnancy, antepartum on their problem list.  Patient reports no complaints.  Contractions: Not present. Vag. Bleeding: None.  Movement: Present. Denies leaking of fluid.   The following portions of the patient's history were reviewed and updated as appropriate: allergies, current medications, past family history, past medical history, past social history, past surgical history and problem list. Problem list updated.  Objective:   Vitals:   12/03/20 1112  BP: 118/79  Pulse: (!) 106  Weight: 241 lb 14.4 oz (109.7 kg)    Fetal Status: Fetal Heart Rate (bpm): 133 Fundal Height: 35 cm Movement: Present     General:  Alert, oriented and cooperative. Patient is in no acute distress.  Skin: Skin is warm and dry. No rash noted.   Cardiovascular: Normal heart rate noted  Respiratory: Normal respiratory effort, no problems with respiration noted  Abdomen: Soft, gravid, appropriate for gestational age. Pain/Pressure: Present     Pelvic:  Cervical exam deferred        Extremities: Normal range of motion.  Edema: None  Mental Status: Normal mood and affect. Normal behavior. Normal judgment and thought content.   Urinalysis:      Assessment and Plan:  Pregnancy: G4P1011 at [redacted]w[redacted]d  1. Supervision of other normal pregnancy, antepartum Doing well No questions Given info in AVS to read more about IUD - to bring questions to next visit.  Has always used Depo in the past and became pregnant while taking pills. Last ultrasound reviewed - normal findings.  Preterm labor symptoms and general obstetric precautions including but not limited to vaginal bleeding, contractions, leaking of fluid and fetal movement were reviewed in detail with the patient. Please refer to  After Visit Summary for other counseling recommendations.  Return in about 2 weeks (around 12/17/2020) for in person ROB - midwife preferred.  Nolene Bernheim, RN, MSN, NP-BC Nurse Practitioner, [facility] for [employer], [facility] 12/03/2020 11:22 AM

## 2020-12-03 NOTE — Patient Instructions (Signed)

## 2020-12-22 DIAGNOSIS — Z419 Encounter for procedure for purposes other than remedying health state, unspecified: Secondary | ICD-10-CM | POA: Diagnosis not present

## 2020-12-23 ENCOUNTER — Ambulatory Visit (INDEPENDENT_AMBULATORY_CARE_PROVIDER_SITE_OTHER): Payer: Medicaid Other | Admitting: Advanced Practice Midwife

## 2020-12-23 ENCOUNTER — Other Ambulatory Visit: Payer: Self-pay

## 2020-12-23 ENCOUNTER — Encounter: Payer: Self-pay | Admitting: Advanced Practice Midwife

## 2020-12-23 VITALS — BP 105/77 | HR 102 | Wt 255.0 lb

## 2020-12-23 DIAGNOSIS — Z3A35 35 weeks gestation of pregnancy: Secondary | ICD-10-CM

## 2020-12-23 DIAGNOSIS — Z3480 Encounter for supervision of other normal pregnancy, unspecified trimester: Secondary | ICD-10-CM

## 2020-12-23 NOTE — Progress Notes (Signed)
   PRENATAL VISIT NOTE  Subjective:  Laurie Cruz is a 30 y.o. G4P1011 at [redacted]w[redacted]d being seen today for ongoing prenatal care.  She is currently monitored for the following issues for this low-risk pregnancy and has Asthma and Supervision of other normal pregnancy, antepartum on their problem list.  Patient reports no complaints.  Contractions: Irritability. Vag. Bleeding: None.  Movement: Present. Denies leaking of fluid.   The following portions of the patient's history were reviewed and updated as appropriate: allergies, current medications, past family history, past medical history, past social history, past surgical history and problem list.   Objective:   Vitals:   12/23/20 1422  BP: 105/77  Pulse: (!) 102  Weight: 255 lb (115.7 kg)    Fetal Status: Fetal Heart Rate (bpm): 152   Movement: Present     General:  Alert, oriented and cooperative. Patient is in no acute distress.  Skin: Skin is warm and dry. No rash noted.   Cardiovascular: Normal heart rate noted  Respiratory: Normal respiratory effort, no problems with respiration noted  Abdomen: Soft, gravid, appropriate for gestational age.  Pain/Pressure: Present     Pelvic: Cervical exam deferred        Extremities: Normal range of motion.  Edema: None  Mental Status: Normal mood and affect. Normal behavior. Normal judgment and thought content.   Assessment and Plan:  Pregnancy: G4P1011 at [redacted]w[redacted]d 1. Supervision of other normal pregnancy, antepartum - routine care  2. [redacted] weeks gestation of pregnancy - GBS at next visit   Preterm labor symptoms and general obstetric precautions including but not limited to vaginal bleeding, contractions, leaking of fluid and fetal movement were reviewed in detail with the patient. Please refer to After Visit Summary for other counseling recommendations.   Return in about 1 week (around 12/30/2020).  No future appointments.  Thressa Sheller DNP, CNM  12/23/20  2:36 PM

## 2021-01-04 ENCOUNTER — Other Ambulatory Visit (HOSPITAL_COMMUNITY)
Admission: RE | Admit: 2021-01-04 | Discharge: 2021-01-04 | Disposition: A | Discharge status: Home / Self Care | Payer: Medicaid Other | Source: Ambulatory Visit | Attending: Obstetrics and Gynecology | Admitting: Obstetrics and Gynecology

## 2021-01-04 ENCOUNTER — Other Ambulatory Visit: Payer: Self-pay

## 2021-01-04 ENCOUNTER — Ambulatory Visit (INDEPENDENT_AMBULATORY_CARE_PROVIDER_SITE_OTHER): Payer: Medicaid Other

## 2021-01-04 VITALS — BP 120/83 | HR 86 | Wt 261.0 lb

## 2021-01-04 DIAGNOSIS — Z3A37 37 weeks gestation of pregnancy: Secondary | ICD-10-CM | POA: Diagnosis not present

## 2021-01-04 DIAGNOSIS — Z3480 Encounter for supervision of other normal pregnancy, unspecified trimester: Secondary | ICD-10-CM

## 2021-01-04 NOTE — Progress Notes (Signed)
   PRENATAL VISIT NOTE  Subjective:  Laurie Cruz is a 30 y.o. G4P1011 at [redacted]w[redacted]d being seen today for ongoing prenatal care.  She is currently monitored for the following issues for this low-risk pregnancy and has Asthma and Supervision of other normal pregnancy, antepartum on their problem list.  Patient reports no complaints.  Contractions: Irritability. Vag. Bleeding: None.  Movement: Present. Denies leaking of fluid.   The following portions of the patient's history were reviewed and updated as appropriate: allergies, current medications, past family history, past medical history, past social history, past surgical history and problem list.   Objective:   Vitals:   01/04/21 1321  BP: 120/83  Pulse: 86  Weight: 261 lb (118.4 kg)    Fetal Status: Fetal Heart Rate (bpm): 129 Fundal Height: 38 cm Movement: Present  Presentation: Vertex  General:  Alert, oriented and cooperative. Patient is in no acute distress.  Skin: Skin is warm and dry. No rash noted.   Cardiovascular: Normal heart rate noted  Respiratory: Normal respiratory effort, no problems with respiration noted  Abdomen: Soft, gravid, appropriate for gestational age.  Pain/Pressure: Present     Pelvic: Cervical exam performed in the presence of a chaperone Dilation: 2 Effacement (%): 50 Station: -3  Extremities: Normal range of motion.  Edema: Trace  Mental Status: Normal mood and affect. Normal behavior. Normal judgment and thought content.   Assessment and Plan:  Pregnancy: G4P1011 at [redacted]w[redacted]d  1. Supervision of other normal pregnancy, antepartum - Routine OB care, no complaints - GBS and GC/CT collected today - Cervical exam per patient request - Anticipatory guidance for upcoming appointment provided  2. [redacted] weeks gestation of pregnancy   Term labor symptoms and general obstetric precautions including but not limited to vaginal bleeding, contractions, leaking of fluid and fetal movement were reviewed in detail  with the patient. Please refer to After Visit Summary for other counseling recommendations.   Return in about 1 week (around 01/11/2021).  Future Appointments  Date Time Provider Department Center  01/14/2021 10:50 AM Kathlene Cote [facility]    Brand Males, CNM 01/04/21 2:06 PM

## 2021-01-05 LAB — GC/CHLAMYDIA PROBE AMP (~~LOC~~) NOT AT ARMC
Chlamydia: NEGATIVE
Comment: NEGATIVE
Comment: NORMAL
Neisseria Gonorrhea: NEGATIVE

## 2021-01-08 LAB — CULTURE, BETA STREP (GROUP B ONLY): Strep Gp B Culture: NEGATIVE

## 2021-01-14 ENCOUNTER — Ambulatory Visit (INDEPENDENT_AMBULATORY_CARE_PROVIDER_SITE_OTHER): Payer: Medicaid Other | Admitting: Medical

## 2021-01-14 ENCOUNTER — Other Ambulatory Visit: Payer: Self-pay

## 2021-01-14 VITALS — BP 124/80 | HR 104 | Wt 268.6 lb

## 2021-01-14 DIAGNOSIS — Z3480 Encounter for supervision of other normal pregnancy, unspecified trimester: Secondary | ICD-10-CM

## 2021-01-14 DIAGNOSIS — Z3A39 39 weeks gestation of pregnancy: Secondary | ICD-10-CM

## 2021-01-14 NOTE — Progress Notes (Signed)
   PRENATAL VISIT NOTE  Subjective:  Laurie Cruz is a 30 y.o. G4P1011 at [redacted]w[redacted]d being seen today for ongoing prenatal care.  She is currently monitored for the following issues for this low-risk pregnancy and has Asthma and Supervision of other normal pregnancy, antepartum on their problem list.  Patient reports no complaints.  Contractions: Irritability. Vag. Bleeding: None.  Movement: Present. Denies leaking of fluid.   The following portions of the patient's history were reviewed and updated as appropriate: allergies, current medications, past family history, past medical history, past social history, past surgical history and problem list.   Objective:   Vitals:   01/14/21 1133 01/14/21 1147  BP: 124/90 124/80  Pulse: 93 (!) 104  Weight: 268 lb 9.6 oz (121.8 kg)     Fetal Status: Fetal Heart Rate (bpm): 139   Movement: Present  Presentation: Vertex  General:  Alert, oriented and cooperative. Patient is in no acute distress.  Skin: Skin is warm and dry. No rash noted.   Cardiovascular: Normal heart rate noted  Respiratory: Normal respiratory effort, no problems with respiration noted  Abdomen: Soft, gravid, appropriate for gestational age.  Pain/Pressure: Absent     Pelvic: Cervical exam performed in the presence of a chaperone Dilation: 2 Effacement (%): 50 Station: -3  Extremities: Normal range of motion.  Edema: Trace  Mental Status: Normal mood and affect. Normal behavior. Normal judgment and thought content.   Assessment and Plan:  Pregnancy: G4P1011 at [redacted]w[redacted]d 1. Supervision of other normal pregnancy, antepartum - Doing well - Discussed IOL, patient would prefer not to have IOL, so will wait until next week to discuss again    2. [redacted] weeks gestation  Term labor symptoms and general obstetric precautions including but not limited to vaginal bleeding, contractions, leaking of fluid and fetal movement were reviewed in detail with the patient. Please refer to After  Visit Summary for other counseling recommendations.   Return in about 1 week (around 01/21/2021) for LOB, Midwife preferred, In-Person.  No future appointments.  Vonzella Nipple, PA-C

## 2021-01-20 ENCOUNTER — Other Ambulatory Visit: Payer: Self-pay

## 2021-01-20 ENCOUNTER — Ambulatory Visit (INDEPENDENT_AMBULATORY_CARE_PROVIDER_SITE_OTHER): Payer: Medicaid Other | Admitting: Certified Nurse Midwife

## 2021-01-20 VITALS — BP 124/97 | HR 90 | Wt 270.8 lb

## 2021-01-20 DIAGNOSIS — O163 Unspecified maternal hypertension, third trimester: Secondary | ICD-10-CM

## 2021-01-20 DIAGNOSIS — Z5941 Food insecurity: Secondary | ICD-10-CM

## 2021-01-20 DIAGNOSIS — Z3493 Encounter for supervision of normal pregnancy, unspecified, third trimester: Secondary | ICD-10-CM

## 2021-01-20 DIAGNOSIS — Z3A39 39 weeks gestation of pregnancy: Secondary | ICD-10-CM

## 2021-01-20 LAB — COMPREHENSIVE METABOLIC PANEL
ALT: 12 IU/L (ref 0–32)
AST: 18 IU/L (ref 0–40)
Albumin/Globulin Ratio: 1.6 (ref 1.2–2.2)
Albumin: 3.6 g/dL — ABNORMAL LOW (ref 3.8–4.8)
Alkaline Phosphatase: 102 IU/L (ref 44–121)
BUN/Creatinine Ratio: 18 (ref 9–23)
BUN: 9 mg/dL (ref 6–20)
Bilirubin Total: 0.2 mg/dL (ref 0.0–1.2)
CO2: 22 mmol/L (ref 20–29)
Calcium: 8.9 mg/dL (ref 8.7–10.2)
Chloride: 104 mmol/L (ref 96–106)
Creatinine, Ser: 0.51 mg/dL — ABNORMAL LOW (ref 0.57–1.00)
Globulin, Total: 2.3 g/dL (ref 1.5–4.5)
Glucose: 94 mg/dL (ref 65–99)
Potassium: 4 mmol/L (ref 3.5–5.2)
Sodium: 134 mmol/L (ref 134–144)
Total Protein: 5.9 g/dL — ABNORMAL LOW (ref 6.0–8.5)
eGFR: 128 mL/min/{1.73_m2} (ref >59)

## 2021-01-20 LAB — CBC
Hematocrit: 34.9 % (ref 34.0–46.6)
Hemoglobin: 11.7 g/dL (ref 11.1–15.9)
MCH: 29.5 pg (ref 26.6–33.0)
MCHC: 33.5 g/dL (ref 31.5–35.7)
MCV: 88 fL (ref 79–97)
Platelets: 292 10*3/uL (ref 150–450)
RBC: 3.96 x10E6/uL (ref 3.77–5.28)
RDW: 14 % (ref 11.7–15.4)
WBC: 10.5 10*3/uL (ref 3.4–10.8)

## 2021-01-20 NOTE — Progress Notes (Signed)
   PRENATAL VISIT NOTE  Subjective:  Laurie Cruz is a 30 y.o. G4P1011 at [redacted]w[redacted]d being seen today for ongoing prenatal care.  She is currently monitored for the following issues for this low-risk pregnancy and has Asthma and Supervision of other normal pregnancy, antepartum on their problem list.  Patient reports  edema in calves/ankles/feet and readiness to be done being pregnant but no other physical complaints. Denies HA, epigastric pain, dizziness or visual disturbances . Strongly desires to avoid induction.  Contractions: Irritability. Vag. Bleeding: None.  Movement: Present. Denies leaking of fluid.   The following portions of the patient's history were reviewed and updated as appropriate: allergies, current medications, past family history, past medical history, past social history, past surgical history and problem list.   Objective:   Vitals:   01/20/21 1109 01/20/21 1146  BP: (!) 124/91 (!) 124/97  Pulse: 90 90  Weight: 270 lb 12.8 oz (122.8 kg)     Fetal Status: Fetal Heart Rate (bpm): 142 Fundal Height: 39 cm Movement: Present  Presentation: Vertex  General:  Alert, oriented and cooperative. Patient is in no acute distress.  Skin: Skin is warm and dry. No rash noted.   Cardiovascular: Normal heart rate noted  Respiratory: Normal respiratory effort, no problems with respiration noted  Abdomen: Soft, gravid, appropriate for gestational age.  Pain/Pressure: Absent     Pelvic: Cervical exam deferred        Extremities: Normal range of motion.  Edema: Mild pitting, slight indentation  Mental Status: Normal mood and affect. Normal behavior. Normal judgment and thought content.   Assessment and Plan:  Pregnancy: G4P1011 at [redacted]w[redacted]d 1. Supervision of low-risk pregnancy, third trimester - Doing well, feeling regular and vigorous fetal movement  2. [redacted] weeks gestation of pregnancy - Routine OB care - Lengthy discussion on post-dates IOL with a favorable cervix. Copious  reassurance given should IOL be indicated either for post-dates or gHTN - Will schedule post-dates IOL at next visit  3. Elevated blood pressure affecting pregnancy in third trimester, antepartum - Agreed to get stat labs and schedule BP check on Monday. Pt understands if she develops symptoms or BP is elevated again on Monday she will need to come to MAU OR come immediately if labs today come back abnormal. - CBC - Comp Met (CMET)  4. Food insecurity - AMBULATORY REFERRAL TO [place] [facility]  Term labor symptoms and general obstetric precautions including but not limited to vaginal bleeding, contractions, leaking of fluid and fetal movement were reviewed in detail with the patient. Please refer to After Visit Summary for other counseling recommendations.   Return in about 1 week (around 01/27/2021) for IN-PERSON, LOB.  Future Appointments  Date Time Provider Benjamin  01/26/2021  2:35 PM Helaine Chess [facility]  01/28/2021  3:30 PM [facility] NURSE [facility]  01/28/2021  3:45 PM [facility] US1 [facility] [place]    Gabriel Carina, CNM

## 2021-01-21 ENCOUNTER — Telehealth: Payer: Self-pay | Admitting: *Deleted

## 2021-01-21 ENCOUNTER — Inpatient Hospital Stay (HOSPITAL_COMMUNITY): Payer: Medicaid Other | Admitting: Anesthesiology

## 2021-01-21 ENCOUNTER — Inpatient Hospital Stay (HOSPITAL_COMMUNITY)
Admission: AD | Admit: 2021-01-21 | Discharge: 2021-01-23 | DRG: 807 | Disposition: A | Discharge status: Home / Self Care | Payer: Medicaid Other | Attending: Family Medicine | Admitting: Family Medicine

## 2021-01-21 ENCOUNTER — Encounter (HOSPITAL_COMMUNITY): Payer: Self-pay | Admitting: Family Medicine

## 2021-01-21 DIAGNOSIS — Z30430 Encounter for insertion of intrauterine contraceptive device: Secondary | ICD-10-CM

## 2021-01-21 DIAGNOSIS — Z3A40 40 weeks gestation of pregnancy: Secondary | ICD-10-CM | POA: Diagnosis not present

## 2021-01-21 DIAGNOSIS — O329XX0 Maternal care for malpresentation of fetus, unspecified, not applicable or unspecified: Secondary | ICD-10-CM

## 2021-01-21 DIAGNOSIS — Z30014 Encounter for initial prescription of intrauterine contraceptive device: Secondary | ICD-10-CM | POA: Diagnosis not present

## 2021-01-21 DIAGNOSIS — Z20822 Contact with and (suspected) exposure to covid-19: Secondary | ICD-10-CM | POA: Diagnosis not present

## 2021-01-21 DIAGNOSIS — O139 Gestational [pregnancy-induced] hypertension without significant proteinuria, unspecified trimester: Secondary | ICD-10-CM | POA: Diagnosis present

## 2021-01-21 DIAGNOSIS — O4292 Full-term premature rupture of membranes, unspecified as to length of time between rupture and onset of labor: Secondary | ICD-10-CM | POA: Diagnosis not present

## 2021-01-21 DIAGNOSIS — O4290 Premature rupture of membranes, unspecified as to length of time between rupture and onset of labor, unspecified weeks of gestation: Secondary | ICD-10-CM

## 2021-01-21 DIAGNOSIS — O164 Unspecified maternal hypertension, complicating childbirth: Secondary | ICD-10-CM | POA: Diagnosis not present

## 2021-01-21 DIAGNOSIS — Z3480 Encounter for supervision of other normal pregnancy, unspecified trimester: Secondary | ICD-10-CM

## 2021-01-21 DIAGNOSIS — O99214 Obesity complicating childbirth: Secondary | ICD-10-CM | POA: Diagnosis present

## 2021-01-21 DIAGNOSIS — Z419 Encounter for procedure for purposes other than remedying health state, unspecified: Secondary | ICD-10-CM | POA: Diagnosis not present

## 2021-01-21 DIAGNOSIS — O134 Gestational [pregnancy-induced] hypertension without significant proteinuria, complicating childbirth: Secondary | ICD-10-CM | POA: Diagnosis not present

## 2021-01-21 DIAGNOSIS — Z3A00 Weeks of gestation of pregnancy not specified: Secondary | ICD-10-CM | POA: Diagnosis not present

## 2021-01-21 DIAGNOSIS — O4202 Full-term premature rupture of membranes, onset of labor within 24 hours of rupture: Secondary | ICD-10-CM | POA: Diagnosis not present

## 2021-01-21 DIAGNOSIS — O26893 Other specified pregnancy related conditions, third trimester: Secondary | ICD-10-CM | POA: Diagnosis not present

## 2021-01-21 LAB — COMPREHENSIVE METABOLIC PANEL
ALT: 16 U/L (ref 0–44)
AST: 25 U/L (ref 15–41)
Albumin: 2.8 g/dL — ABNORMAL LOW (ref 3.5–5.0)
Alkaline Phosphatase: 86 U/L (ref 38–126)
Anion gap: 8 (ref 5–15)
BUN: 8 mg/dL (ref 6–20)
CO2: 22 mmol/L (ref 22–32)
Calcium: 8.9 mg/dL (ref 8.9–10.3)
Chloride: 104 mmol/L (ref 98–111)
Creatinine, Ser: 0.58 mg/dL (ref 0.44–1.00)
GFR, Estimated: >60 mL/min (ref >60)
Glucose, Bld: 114 mg/dL — ABNORMAL HIGH (ref 70–99)
Potassium: 3.6 mmol/L (ref 3.5–5.1)
Sodium: 134 mmol/L — ABNORMAL LOW (ref 135–145)
Total Bilirubin: 0.6 mg/dL (ref 0.3–1.2)
Total Protein: 6.4 g/dL — ABNORMAL LOW (ref 6.5–8.1)

## 2021-01-21 LAB — CBC
HCT: 36.4 % (ref 36.0–46.0)
Hemoglobin: 11.6 g/dL — ABNORMAL LOW (ref 12.0–15.0)
MCH: 29.7 pg (ref 26.0–34.0)
MCHC: 31.9 g/dL (ref 30.0–36.0)
MCV: 93.1 fL (ref 80.0–100.0)
Platelets: 286 10*3/uL (ref 150–400)
RBC: 3.91 MIL/uL (ref 3.87–5.11)
RDW: 13.5 % (ref 11.5–15.5)
WBC: 9.4 10*3/uL (ref 4.0–10.5)
nRBC: 0 % (ref 0.0–0.2)

## 2021-01-21 LAB — RESP PANEL BY RT-PCR (FLU A&B, COVID) ARPGX2
Influenza A by PCR: NEGATIVE
Influenza B by PCR: NEGATIVE
SARS Coronavirus 2 by RT PCR: NEGATIVE

## 2021-01-21 LAB — POCT FERN TEST: POCT Fern Test: POSITIVE

## 2021-01-21 LAB — TYPE AND SCREEN
ABO/RH(D): O POS
Antibody Screen: NEGATIVE

## 2021-01-21 LAB — PROTEIN / CREATININE RATIO, URINE
Creatinine, Urine: 195.37 mg/dL
Protein Creatinine Ratio: 0.09 mg/mg{Cre} (ref 0.00–0.15)
Total Protein, Urine: 17 mg/dL

## 2021-01-21 MED ORDER — SOD CITRATE-CITRIC ACID 500-334 MG/5ML PO SOLN: 30.0000 mL | ORAL | Status: DC | PRN

## 2021-01-21 MED ORDER — TERBUTALINE SULFATE 1 MG/ML IJ SOLN: 0.2500 mg | Freq: Once | INTRAMUSCULAR | Status: DC | PRN

## 2021-01-21 MED ORDER — ONDANSETRON HCL 4 MG/2ML IJ SOLN: 4.0000 mg | Freq: Four times a day (QID) | INTRAMUSCULAR | Status: DC | PRN

## 2021-01-21 MED ORDER — EPHEDRINE 5 MG/ML INJ: 10.0000 mg | INTRAVENOUS | Status: DC | PRN

## 2021-01-21 MED ORDER — ACETAMINOPHEN 325 MG PO TABS: 650.0000 mg | ORAL_TABLET | ORAL | Status: DC | PRN

## 2021-01-21 MED ORDER — OXYTOCIN BOLUS FROM INFUSION: 333.0000 mL | Freq: Once | INTRAVENOUS | Status: DC

## 2021-01-21 MED ORDER — OXYTOCIN-SODIUM CHLORIDE 30-0.9 UT/500ML-% IV SOLN
1.0000 m[IU]/min | INTRAVENOUS | Status: DC
  Filled 2021-01-21: qty 500

## 2021-01-21 MED ORDER — OXYCODONE-ACETAMINOPHEN 5-325 MG PO TABS: 2.0000 | ORAL_TABLET | ORAL | Status: DC | PRN

## 2021-01-21 MED ORDER — OXYCODONE-ACETAMINOPHEN 5-325 MG PO TABS
1.0000 | ORAL_TABLET | ORAL | Status: DC | PRN
Start: 2021-01-21 — End: 2021-01-22

## 2021-01-21 MED ORDER — LACTATED RINGERS IV SOLN: 500.0000 mL | Freq: Once | INTRAVENOUS | Status: DC

## 2021-01-21 MED ORDER — OXYTOCIN-SODIUM CHLORIDE 30-0.9 UT/500ML-% IV SOLN
2.5000 [IU]/h | INTRAVENOUS | Status: DC
  Administered 2021-01-22: 2.5 [IU]/h via INTRAVENOUS

## 2021-01-21 MED ORDER — FENTANYL-BUPIVACAINE-NACL 0.5-0.125-0.9 MG/250ML-% EP SOLN
12.0000 mL/h | EPIDURAL | Status: DC | PRN
  Administered 2021-01-21: 12 mL/h via EPIDURAL
  Filled 2021-01-21: qty 250

## 2021-01-21 MED ORDER — PHENYLEPHRINE 40 MCG/ML (10ML) SYRINGE FOR IV PUSH (FOR BLOOD PRESSURE SUPPORT): 80.0000 ug | PREFILLED_SYRINGE | INTRAVENOUS | Status: DC | PRN

## 2021-01-21 MED ORDER — LIDOCAINE HCL (PF) 1 % IJ SOLN
INTRAMUSCULAR | Status: DC | PRN
  Administered 2021-01-21: 11 mL via EPIDURAL

## 2021-01-21 MED ORDER — LIDOCAINE HCL (PF) 1 % IJ SOLN: 30.0000 mL | INTRAMUSCULAR | Status: DC | PRN

## 2021-01-21 MED ORDER — PARAGARD INTRAUTERINE COPPER IU IUD
INTRAUTERINE_SYSTEM | Freq: Once | INTRAUTERINE | Status: DC
  Filled 2021-01-21: qty 1

## 2021-01-21 MED ORDER — OXYTOCIN-SODIUM CHLORIDE 30-0.9 UT/500ML-% IV SOLN: 2.5000 [IU]/h | INTRAVENOUS | Status: DC

## 2021-01-21 MED ORDER — FLEET ENEMA 7-19 GM/118ML RE ENEM: 1.0000 | ENEMA | Freq: Every day | RECTAL | Status: DC | PRN

## 2021-01-21 MED ORDER — OXYCODONE-ACETAMINOPHEN 5-325 MG PO TABS: 1.0000 | ORAL_TABLET | ORAL | Status: DC | PRN

## 2021-01-21 MED ORDER — LACTATED RINGERS IV SOLN: 500.0000 mL | INTRAVENOUS | Status: DC | PRN

## 2021-01-21 MED ORDER — LACTATED RINGERS IV SOLN: INTRAVENOUS | Status: DC

## 2021-01-21 MED ORDER — FENTANYL CITRATE (PF) 100 MCG/2ML IJ SOLN
100.0000 ug | INTRAMUSCULAR | Status: DC | PRN
  Administered 2021-01-21 (×2): 100 ug via INTRAVENOUS
  Filled 2021-01-21 (×2): qty 2

## 2021-01-21 MED ORDER — DIPHENHYDRAMINE HCL 50 MG/ML IJ SOLN: 12.5000 mg | INTRAMUSCULAR | Status: DC | PRN

## 2021-01-21 MED ORDER — ONDANSETRON HCL 4 MG/2ML IJ SOLN
4.0000 mg | Freq: Four times a day (QID) | INTRAMUSCULAR | Status: DC | PRN
  Administered 2021-01-21: 4 mg via INTRAVENOUS
  Filled 2021-01-21: qty 2

## 2021-01-21 MED ORDER — OXYTOCIN-SODIUM CHLORIDE 30-0.9 UT/500ML-% IV SOLN
1.0000 m[IU]/min | INTRAVENOUS | Status: DC
  Administered 2021-01-21: 2 m[IU]/min via INTRAVENOUS

## 2021-01-21 NOTE — Progress Notes (Signed)
Vtx confirmed by [facility].  

## 2021-01-21 NOTE — Telephone Encounter (Signed)
Called patient to discuss her 2 Mychart messages sent last pm and this am. She confirmed her water had broke this am - wateer with mucous and that she isn't really having contractions but is beginning to  have a little back pain. She confirms baby is moving well. I instructed her to go to [facility] for evaluation and to be admitted . She voices understanding. Sakeena Teall,RN

## 2021-01-21 NOTE — MAU Note (Signed)
Feels like her water is broken, puddle on the bed @ 0740. Clear fluid and some mucous.  Pad on, feels like it is still coming.  No bleeding. Cramping. Was 2 cm yesterday

## 2021-01-21 NOTE — MAU Note (Signed)
BP up yesterday, blood work came back ok.  Had a slight HA this morning,gone after eating; denies visual changes, epigastric pain no increase in swelling.

## 2021-01-21 NOTE — Anesthesia Procedure Notes (Signed)
Epidural Patient location during procedure: OB Start time: 01/21/2021 11:35 PM End time: 01/21/2021 11:46 PM  Staffing Anesthesiologist: Lowella Curb, MD  Preanesthetic Checklist Completed: patient identified, IV checked, site marked, risks and benefits discussed, surgical consent, monitors and equipment checked, pre-op evaluation and timeout performed  Epidural Patient position: sitting Prep: ChloraPrep Patient monitoring: heart rate, cardiac monitor, continuous pulse ox and blood pressure Approach: midline Location: L2-L3 Injection technique: LOR saline  Needle:  Needle type: Tuohy  Needle gauge: 17 G Needle length: 9 cm Needle insertion depth: 7 cm Catheter type: closed end flexible Catheter size: 20 Guage Catheter at skin depth: 12 cm Test dose: negative  Assessment Events: blood not aspirated, injection not painful, no injection resistance, no paresthesia and negative IV test  Additional Notes Epidural placed by SRNA under direct supervisionReason for block:procedure for pain

## 2021-01-21 NOTE — Progress Notes (Addendum)
Labor Progress Note Laurie Cruz is a 30 y.o. [phone] at [redacted]w[redacted]d presented for ROM at 07:40.  S: Patient is resting comfortably.  O:  BP (!) 131/94   Pulse (!) 112   Temp 98.3 F (36.8 C)   Resp 17   Ht 5\' 5"  (1.651 m)   Wt 122.1 kg   LMP 04/16/2020   SpO2 99%   BMI 44.80 kg/m  EFM: baseline 140 BPM/mod variability/+ accels/-decels  Toco: minimal contractions   CVE: Dilation: 2 Effacement (%): 50 Station: -3 Presentation: Vertex Exam by:: CNM Micha Dosanjh   A&P: 30 y.o. 38 [redacted]w[redacted]d presented for ROM at 07:40.  #Labor:  Not progressing, minimal contractions. Will begin pitocin and cont to monitor.  #Pain: PRN  #FWB: cat 1  #GBS negative #Childhood Asthma: no exacerbations since childhood/not using any inhalers    [redacted]w[redacted]d, MD, PGY 1 Center for Alfredo Martinez, [facility] 4:33 PM  Attestation of Supervision of Student:  I confirm that I have verified the information documented in the  resident   student's note and that I have also personally reperformed the history, physical exam and all medical decision making activities.  I have verified that all services and findings are accurately documented in this student's note; and I agree with management and plan as outlined in the documentation. I have also made any necessary editorial changes.   -Patient not feeling contractions -Patient cervix unchanged per RN so will start pitocin Patient Vitals for the past 24 hrs:  BP Temp Temp src Pulse Resp SpO2 Height Weight  01/21/21 1605 (!) 131/94 -- -- (!) 112 -- -- -- --  01/21/21 1604 -- 98.3 F (36.8 C) -- -- 17 -- -- --  01/21/21 1444 129/72 -- -- 97 18 -- -- --  01/21/21 1258 123/89 -- -- 99 17 -- -- --  01/21/21 1147 (!) 123/92 98.5 F (36.9 C) -- 92 -- -- -- --  01/21/21 1131 122/75 -- -- (!) 109 -- 99 % -- --  01/21/21 1120 118/86 -- -- 99 -- -- -- --  01/21/21 1101 134/83 -- -- (!) 112 -- 99 % -- --  01/21/21 1038 (!) 134/97 -- -- (!) 104 --  -- -- --  01/21/21 1022 (!) 131/97 98.7 F (37.1 C) Oral (!) 105 18 100 % 5\' 5"  (1.651 m) 269 lb 3.2 oz (122.1 kg)     03/24/21, CNM Center for , [facility] 01/21/2021 5:07 PM

## 2021-01-21 NOTE — H&P (Addendum)
OBSTETRIC ADMISSION HISTORY AND PHYSICAL  Laurie Cruz is a 30 y.o. female [phone] with IUP at [redacted]w[redacted]d by LMP presenting for ROM at 07:40. She reports +FMs, no VB, no blurry vision, headaches or peripheral edema, and RUQ pain.  She plans on formula feeding. She requests post placental copper IUD for birth control. She received her prenatal care at [facility].  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 02/23/2017    Past Surgical History: Past Surgical History:  Procedure Laterality Date   ORIF ANKLE FRACTURE Right 02/23/2017   Procedure: OPEN REDUCTION INTERNAL FIXATION (ORIF) RIGHT TRIMALLEOLAR  FRACTURE;  Surgeon: 04/25/2017, MD;  Location: [facility] OR;  Service: Orthopedics;  Laterality: Right;    Obstetrical History: OB History     Gravida  4   Para  0   Term  0   Preterm  0   AB  2   Living  1      SAB      IAB  2   Ectopic  0   Multiple  0   Live Births  1           Social History Social History   Socioeconomic History   Marital status: Single    Spouse name: Myrene Galas   Number of children: 1   Years of education: Not on file   Highest education level: Bachelor's degree (e.g., BA, AB, BS)  Occupational History   Occupation: [employer]  Tobacco Use   Smoking status: Never   Smokeless tobacco: Never  Vaping Use   Vaping Use: Never used  Substance and Sexual Activity   Alcohol use: Not Currently    Comment: social   Drug use: Not Currently    Types: Marijuana   Sexual activity: Yes    Birth control/protection: None    Comment: Interested in  IUD at delivery  Other Topics Concern   Not on file  Social History Narrative   Not on file   Social Determinants of Health   Financial Resource Strain: Not on file  Food Insecurity: No Food Insecurity   Worried About Runner, broadcasting/film/video in the Last Year: Never true   Ran Out of Food in the Last Year: Never true  Transportation Needs: No Transportation Needs   Lack of Transportation (Medical): No   Lack of Transportation (Non-Medical): No  Physical Activity: Not on file  Stress: Not on file  Social Connections: Not on file    Family History: Family History  Problem Relation Age of Onset   Anemia Mother    Diabetes Father     Allergies: No Known Allergies  Medications Prior to Admission  Medication Sig Dispense Refill Last Dose   Prenatal Vit-Fe Fumarate-FA (PNV PRENATAL PLUS MULTIVITAMIN) 27-1 MG TABS Take 1 tablet by mouth daily. 30 tablet 11 01/20/2021     Review of Systems   All systems reviewed and negative except as stated in HPI  Blood pressure 123/89, pulse 99, temperature 98.5 F (36.9 C), resp. rate 17, height 5\' 5"  (1.651 m), weight 122.1 kg, last menstrual period 04/16/2020, SpO2  99 %. General appearance: alert, cooperative, and no distress Lung: nl respiratory effort  Heart: regular rate and rhythm Abdomen: gravid, nontender, soft  Pelvic: as noted below Extremities: Homans sign is negative, no sign of DVT  Presentation: cephalic vertex by [facility] Fetal monitoringBaseline: 140 bpm, Variability: Good {> 6 bpm), Accelerations: Present, Decelerations: Absent, and   Uterine activity: minimal contractions     Prenatal labs: ABO, Rh: --/--/O POS (07/01 1120) Antibody: NEG (07/01 1120) Rubella: 7.43 (01/13 1634) RPR: Non Reactive (04/12 0931)  HBsAg: Negative (01/13 1634)  HIV: Non Reactive (04/12 0931)  GBS: Negative/-- (06/14 1445)  2 hr Glucola passed Genetic screening  low risk Anatomy US normal  Prenatal Transfer Tool  Maternal Diabetes:  No Genetic Screening: Normal Maternal Ultrasounds/Referrals: Normal Fetal Ultrasounds or other Referrals:  None Maternal Substance Abuse:  No Significant Maternal Medications:  None Significant Maternal Lab Results: Group B Strep negative  Results for orders placed or performed during the hospital encounter of 01/21/21 (from the past 24 hour(s))  POCT fern test   Collection Time: 01/21/21 10:52 AM  Result Value Ref Range   POCT Fern Test Positive = ruptured amniotic membanes   Comprehensive metabolic panel   Collection Time: 01/21/21 10:53 AM  Result Value Ref Range   Sodium 134 (L) 135 - 145 mmol/L   Potassium 3.6 3.5 - 5.1 mmol/L   Chloride 104 98 - 111 mmol/L   CO2 22 22 - 32 mmol/L   Glucose, Bld 114 (H) 70 - 99 mg/dL   BUN 8 6 - 20 mg/dL   Creatinine, Ser 1.61 0.44 - 1.00 mg/dL   Calcium 8.9 8.9 - 09.6 mg/dL   Total Protein 6.4 (L) 6.5 - 8.1 g/dL   Albumin 2.8 (L) 3.5 - 5.0 g/dL   AST 25 15 - 41 U/L   ALT 16 0 - 44 U/L   Alkaline Phosphatase 86 38 - 126 U/L   Total Bilirubin 0.6 0.3 - 1.2 mg/dL   GFR, Estimated >04 >54 mL/min   Anion gap 8 5 - 15  CBC   Collection Time: 01/21/21 10:53 AM  Result Value Ref Range   WBC 9.4 4.0 - 10.5 K/uL   RBC 3.91 3.87 - 5.11 MIL/uL   Hemoglobin 11.6 (L) 12.0 - 15.0 g/dL   HCT 09.8 11.9 - 14.7 %   MCV 93.1 80.0 - 100.0 fL   MCH 29.7 26.0 - 34.0 pg   MCHC 31.9 30.0 - 36.0 g/dL   RDW 82.9 56.2 - 13.0 %   Platelets 286 150 - 400 K/uL   nRBC 0.0 0.0 - 0.2 %  Type and screen [facility]   Collection Time: 01/21/21 11:20 AM  Result Value Ref Range   ABO/RH(D) O POS    Antibody Screen NEG    Sample Expiration      01/24/2021,2359 Performed at [facility], [address], [place], [place] [address]   Protein / creatinine ratio, urine   Collection Time: 01/21/21 11:27 AM  Result Value Ref Range   Creatinine, Urine 195.37 mg/dL   Total Protein, Urine 17 mg/dL   Protein Creatinine Ratio 0.09 0.00 - 0.15  mg/mg[Cre]    Patient Active Problem List   Diagnosis Date Noted   Normal labor 01/21/2021   Indication for care in labor and delivery, antepartum 01/21/2021   Morbid obesity (HCC) 01/21/2021   Supervision of other normal pregnancy, antepartum 07/29/2020   Asthma     Assessment/Plan:  [name]  Laurie Cruz is a 30 y.o. G4P0021 at [redacted]w[redacted]d here for ROM at 0740.   #Labor: Based on cervical exam, will recheck pt in 4 hours and augment if necessary.   #Pain: Prn, Possibly try IV pain med prior to epidural  #FWB: Cat 1 #ID: Gbs neg #MOF: formula #MOC: post placental copper IUD/ discussed risks/benefits IUD ordered  #Circ:  N/a #Childhood Asthma: no exacerbations since childhood/not using any inhalers   Alfredo Martinez, MD  01/21/2021, 1:15 PM   Attestation of Supervision of Student:  I confirm that I have verified the information documented in the  resident  note and that I have also personally reperformed the history, physical exam and all medical decision making activities.  I have verified that all services and findings are accurately documented in this student's note; and I agree with management and plan as outlined in the documentation. I have also made any necessary editorial changes.  Patient does not feel contractions; has not been examined after SROM today. Patient to eat and then cervix check; if no change will start pitocin, consider cytotec if patient is thick.   -will consent for PP IUD -patient denies HA, blurry vision, scotomata, RUQ pain, sudden overall body swelling -patient had occasional elevated BPs upon entry to MAU; pre-e labs (platelets, CMP, PCR) all normal.  Patient is asymptomatic; continue to monitor for signs of pre-e. Initiate anti-HTN protocol if necessary.   Marylene Land, CNM Center for [employer], [facility] 01/21/2021 1:45 PM

## 2021-01-21 NOTE — Anesthesia Preprocedure Evaluation (Signed)
Anesthesia Evaluation  Patient identified by MRN, date of birth, ID band Patient awake    Reviewed: Allergy & Precautions, NPO status , Patient's Chart, lab work & pertinent test results  Airway Mallampati: II  TM Distance: >3 FB Neck ROM: Full    Dental no notable dental hx. (+) Teeth Intact, Dental Advisory Given   Pulmonary asthma ,    Pulmonary exam normal breath sounds clear to auscultation       Cardiovascular hypertension, Normal cardiovascular exam Rhythm:Regular Rate:Normal     Neuro/Psych negative neurological ROS  negative psych ROS   GI/Hepatic negative GI ROS, Neg liver ROS,   Endo/Other  negative endocrine ROS  Renal/GU negative Renal ROS  negative genitourinary   Musculoskeletal negative musculoskeletal ROS (+)   Abdominal (+) + obese,   Peds negative pediatric ROS (+)  Hematology negative hematology ROS (+)   Anesthesia Other Findings   Reproductive/Obstetrics (+) Pregnancy                             Anesthesia Physical  Anesthesia Plan  ASA: II  Anesthesia Plan: Epidural   Post-op Pain Management:    Induction:   PONV Risk Score and Plan: Treatment may vary due to age or medical condition  Airway Management Planned:   Additional Equipment:   Intra-op Plan:   Post-operative Plan:   Informed Consent: I have reviewed the patients History and Physical, chart, labs and discussed the procedure including the risks, benefits and alternatives for the proposed anesthesia with the patient or authorized representative who has indicated his/her understanding and acceptance.     Dental advisory given  Plan Discussed with: Anesthesiologist and CRNA  Anesthesia Plan Comments:         Anesthesia Quick Evaluation

## 2021-01-21 NOTE — Progress Notes (Signed)
Laurie Cruz is a 30 y.o. G4P1021 at [redacted]w[redacted]d by LMP admitted for rupture of membranes  N/v , s/o in room for support   Subjective:   Objective: BP (!) 141/77   Pulse 75   Temp 97.8 F (36.6 C)   Resp 17   Ht 5\' 5"  (1.651 m)   Wt 122.1 kg   LMP 04/16/2020   SpO2 99%   BMI 44.80 kg/m  I/O last 3 completed shifts: In: 240 [P.O.:240] Out: -  No intake/output data recorded.  FHT:  FHR: 135 bpm, variability: moderate,  accelerations:  Present,  decelerations:  Absent UC:   regular, every 3 minutes SVE:   Dilation: 4 Effacement (%): 70 Station: -2 Exam by:: A. [email or web address], RN  Labs: Lab Results  Component Value Date   WBC 9.4 01/21/2021   HGB 11.6 (L) 01/21/2021   HCT 36.4 01/21/2021   MCV 93.1 01/21/2021   PLT 286 01/21/2021    Assessment / Plan: Augmentation of labor, progressing well  Labor: Progressing normally Preeclampsia:   labs wnl Fetal Wellbeing:  Category I Pain Control:  Epidural I/D:   GBS neg Anticipated MOD:  NSVD  03/24/2021 01/21/2021, 10:29 PM

## 2021-01-22 DIAGNOSIS — O4202 Full-term premature rupture of membranes, onset of labor within 24 hours of rupture: Secondary | ICD-10-CM

## 2021-01-22 DIAGNOSIS — O134 Gestational [pregnancy-induced] hypertension without significant proteinuria, complicating childbirth: Secondary | ICD-10-CM

## 2021-01-22 DIAGNOSIS — Z3A40 40 weeks gestation of pregnancy: Secondary | ICD-10-CM

## 2021-01-22 DIAGNOSIS — Z30430 Encounter for insertion of intrauterine contraceptive device: Secondary | ICD-10-CM

## 2021-01-22 DIAGNOSIS — Z30014 Encounter for initial prescription of intrauterine contraceptive device: Secondary | ICD-10-CM

## 2021-01-22 LAB — CBC
HCT: 33.6 % — ABNORMAL LOW (ref 36.0–46.0)
Hemoglobin: 11.2 g/dL — ABNORMAL LOW (ref 12.0–15.0)
MCH: 30.4 pg (ref 26.0–34.0)
MCHC: 33.3 g/dL (ref 30.0–36.0)
MCV: 91.1 fL (ref 80.0–100.0)
Platelets: 251 10*3/uL (ref 150–400)
RBC: 3.69 MIL/uL — ABNORMAL LOW (ref 3.87–5.11)
RDW: 13.4 % (ref 11.5–15.5)
WBC: 15.4 10*3/uL — ABNORMAL HIGH (ref 4.0–10.5)
nRBC: 0 % (ref 0.0–0.2)

## 2021-01-22 LAB — RPR: RPR Ser Ql: NONREACTIVE

## 2021-01-22 MED ORDER — SENNOSIDES-DOCUSATE SODIUM 8.6-50 MG PO TABS
2.0000 | ORAL_TABLET | Freq: Every day | ORAL | Status: DC
  Administered 2021-01-23: 2 via ORAL
  Filled 2021-01-22: qty 2

## 2021-01-22 MED ORDER — PRENATAL MULTIVITAMIN CH
1.0000 | ORAL_TABLET | Freq: Every day | ORAL | Status: DC
  Administered 2021-01-22 – 2021-01-23 (×2): 1 via ORAL
  Filled 2021-01-22 (×2): qty 1

## 2021-01-22 MED ORDER — LEVONORGESTREL 20.1 MCG/DAY IU IUD
INTRAUTERINE_SYSTEM | INTRAUTERINE | Status: AC
  Filled 2021-01-22: qty 1

## 2021-01-22 MED ORDER — ONDANSETRON HCL 4 MG PO TABS
4.0000 mg | ORAL_TABLET | ORAL | Status: DC | PRN
Start: 2021-01-22 — End: 2021-01-23

## 2021-01-22 MED ORDER — NIFEDIPINE ER OSMOTIC RELEASE 30 MG PO TB24
30.0000 mg | ORAL_TABLET | Freq: Every day | ORAL | Status: DC
  Administered 2021-01-23: 30 mg via ORAL
  Filled 2021-01-22: qty 1

## 2021-01-22 MED ORDER — BENZOCAINE-MENTHOL 20-0.5 % EX AERO
1.0000 "application " | INHALATION_SPRAY | CUTANEOUS | Status: DC | PRN
  Filled 2021-01-22: qty 56

## 2021-01-22 MED ORDER — WITCH HAZEL-GLYCERIN EX PADS: 1.0000 "application " | MEDICATED_PAD | CUTANEOUS | Status: DC | PRN

## 2021-01-22 MED ORDER — LEVONORGESTREL 20.1 MCG/DAY IU IUD
1.0000 | INTRAUTERINE_SYSTEM | Freq: Once | INTRAUTERINE | Status: AC
  Administered 2021-01-22: 1 via INTRAUTERINE

## 2021-01-22 MED ORDER — DIPHENHYDRAMINE HCL 25 MG PO CAPS: 25.0000 mg | ORAL_CAPSULE | Freq: Four times a day (QID) | ORAL | Status: DC | PRN

## 2021-01-22 MED ORDER — DIBUCAINE (PERIANAL) 1 % EX OINT: 1.0000 "application " | TOPICAL_OINTMENT | CUTANEOUS | Status: DC | PRN

## 2021-01-22 MED ORDER — ZOLPIDEM TARTRATE 5 MG PO TABS: 5.0000 mg | ORAL_TABLET | Freq: Every evening | ORAL | Status: DC | PRN

## 2021-01-22 MED ORDER — TETANUS-DIPHTH-ACELL PERTUSSIS 5-2.5-18.5 LF-MCG/0.5 IM SUSY: 0.5000 mL | PREFILLED_SYRINGE | Freq: Once | INTRAMUSCULAR | Status: DC

## 2021-01-22 MED ORDER — SIMETHICONE 80 MG PO CHEW: 80.0000 mg | CHEWABLE_TABLET | ORAL | Status: DC | PRN

## 2021-01-22 MED ORDER — ONDANSETRON HCL 4 MG/2ML IJ SOLN: 4.0000 mg | INTRAMUSCULAR | Status: DC | PRN

## 2021-01-22 MED ORDER — ACETAMINOPHEN 325 MG PO TABS: 650.0000 mg | ORAL_TABLET | ORAL | Status: DC | PRN

## 2021-01-22 MED ORDER — LEVONORGESTREL 20.1 MCG/DAY IU IUD: 1.0000 | INTRAUTERINE_SYSTEM | Freq: Once | INTRAUTERINE | Status: DC

## 2021-01-22 MED ORDER — IBUPROFEN 600 MG PO TABS
600.0000 mg | ORAL_TABLET | Freq: Four times a day (QID) | ORAL | Status: DC
  Administered 2021-01-22 – 2021-01-23 (×6): 600 mg via ORAL
  Filled 2021-01-22 (×5): qty 1

## 2021-01-22 MED ORDER — COCONUT OIL OIL: 1.0000 "application " | TOPICAL_OIL | Status: DC | PRN

## 2021-01-22 NOTE — Discharge Summary (Signed)
Postpartum Discharge Summary     Patient Name: Laurie Cruz DOB: [birth year] MRN: [id number]  Date of admission: 01/21/2021 Delivery date:01/22/2021  Delivering provider: Fatima Blank A  Date of discharge: 01/23/2021  Admitting diagnosis: Normal labor [O80, Z37.9] Indication for care in labor and delivery, antepartum [O75.9] Intrauterine pregnancy: [redacted]w[redacted]d    Secondary diagnosis:  Principal Problem:   Vaginal delivery Active Problems:   Indication for care in labor and delivery, antepartum   Morbid obesity ([place]   Gestational hypertension   PROM (premature rupture of membranes)   Encounter for IUD insertion  Additional problems:  as noted above  Discharge diagnosis: Term Pregnancy Delivered                                              Post partum procedures: post-placental Liletta IUD Augmentation: Pitocin Complications: None  Hospital course: Onset of Labor With Vaginal Delivery      30y.o. yo [id number] admitted in Active Labor on 01/21/2021. Patient had an uncomplicated labor course as follows:  Membrane Rupture Time/Date: 7:40 AM ,01/21/2021   Delivery Method:Vaginal, Spontaneous  Episiotomy: None  Lacerations:  None  Patient had an uncomplicated postpartum course.  Given persistently elevated blood pressures s/p delivery, pt was discharged on procardia 308maily with plan for 1 week BP check in clinic. She is ambulating, tolerating a regular diet, passing flatus, and urinating well. Patient is discharged home in stable condition on 01/23/21.  Newborn Data: Birth date:01/22/2021  Birth time:12:38 AM  Gender:Female  Living status:Living  Apgars:8 ,9  Weight:3065 g   Magnesium Sulfate received: No BMZ received: No Rhophylac:No MMR:N/A T-DaP:Given prenatally Flu: offered prior to discharge Transfusion:No  Physical exam  Vitals:   01/22/21 1252 01/22/21 1646 01/22/21 2208 01/23/21 0616  BP: 134/89 121/83 124/80 120/85  Pulse: 87 70 74 72   Resp:   18 18  Temp: 98.8 F (37.1 C) 98.4 F (36.9 C) 98.3 F (36.8 C) 98 F (36.7 C)  TempSrc: Oral Oral Oral Oral  SpO2:   100% 100%  Weight:      Height:       General: alert, cooperative, and no distress Lochia: appropriate Uterine Fundus: firm Incision: N/A DVT Evaluation: No evidence of DVT seen on physical exam. No cords or calf tenderness. No significant calf/ankle edema. Labs: Lab Results  Component Value Date   WBC 15.4 (H) 01/22/2021   HGB 11.2 (L) 01/22/2021   HCT 33.6 (L) 01/22/2021   MCV 91.1 01/22/2021   PLT 251 01/22/2021   CMP Latest Ref Rng & Units 01/21/2021  Glucose 70 - 99 mg/dL 114(H)  BUN 6 - 20 mg/dL 8  Creatinine 0.44 - 1.00 mg/dL 0.58  Sodium 135 - 145 mmol/L 134(L)  Potassium 3.5 - 5.1 mmol/L 3.6  Chloride 98 - 111 mmol/L 104  CO2 22 - 32 mmol/L 22  Calcium 8.9 - 10.3 mg/dL 8.9  Total Protein 6.5 - 8.1 g/dL 6.4(L)  Total Bilirubin 0.3 - 1.2 mg/dL 0.6  Alkaline Phos 38 - 126 U/L 86  AST 15 - 41 U/L 25  ALT 0 - 44 U/L 16   Edinburgh Score: No flowsheet data found.   After visit meds:  Allergies as of 01/23/2021   No Known Allergies      Medication List     TAKE these  medications    acetaminophen 325 MG tablet Commonly known as: Tylenol Take 2 tablets (650 mg total) by mouth every 6 (six) hours as needed for mild pain, moderate pain, fever or headache (for pain scale < 4).   coconut oil Oil Apply 1 application topically as needed (nipple pain).   ibuprofen 600 MG tablet Commonly known as: ADVIL Take 1 tablet (600 mg total) by mouth every 8 (eight) hours as needed for moderate pain or cramping.   NIFEdipine 30 MG 24 hr tablet Commonly known as: ADALAT CC Take 1 tablet (30 mg total) by mouth daily.   PNV Prenatal Plus Multivitamin 27-1 MG Tabs Take 1 tablet by mouth daily.         Discharge home in stable condition Infant Feeding: Bottle Infant Disposition:home with mother Discharge instruction: per After Visit  Summary and Postpartum booklet. Activity: Advance as tolerated. Pelvic rest for 6 weeks.  Diet: routine diet Future Appointments: Future Appointments  Date Time Provider [place]  01/26/2021  2:35 PM Helaine Chess [facility]  01/28/2021  3:30 PM [facility] NURSE [facility]  01/28/2021  3:45 PM [facility] US1 [facility] [place]   Follow up Visit:  [place] [facility]. Schedule an appointment as soon as possible for a visit.   Specialty: Obstetrics and Gynecology Why: Blood pressure check in 2-3 days, postpartum appointment in 4 weeks.  Sooner as needed for pregnancy complicattion or symptoms of pre-eclampsia. Contact information: [address] [place] [address] [phone]        [facility] Follow up.   Specialty: Obstetrics and Gynecology Why: As needed in postpartum emergencies Contact information: [address] [phone] [place] [phone]                 Please schedule this patient for a In person postpartum visit in 4 weeks with the following provider:  midwife preferred . Additional Postpartum F/U:BP check 1 week  Low risk pregnancy complicated by:  GHTN on admission Delivery mode:  Vaginal, Spontaneous  Anticipated Birth Control:  PP IUD placed   01/23/2021 Manya Silvas, CNM

## 2021-01-22 NOTE — Procedures (Signed)
  Post-Placental IUD Insertion Procedure Note  Patient identified, informed consent signed prior to delivery, signed copy in chart, time out was performed.    Vaginal, labial and perineal areas thoroughly inspected for lacerations. No lacerations.  Liletta  - IUD inserted with inserter per manufacturer's instructions.    Strings trimmed to the level of the introitus. Patient tolerated procedure well.  Patient given post procedure instructions and IUD care card with expiration date.  Patient is asked to keep IUD strings tucked in her vagina until her postpartum follow up visit in 4-6 weeks. Patient advised to abstain from sexual intercourse and pulling on strings before her follow-up visit. Patient verbalized an understanding of the plan of care and agrees.

## 2021-01-23 MED ORDER — ACETAMINOPHEN 325 MG PO TABS: 650.0000 mg | ORAL_TABLET | Freq: Four times a day (QID) | ORAL | Status: DC | PRN

## 2021-01-23 MED ORDER — COCONUT OIL OIL: 1.0000 "application " | TOPICAL_OIL | 0 refills | Status: DC | PRN

## 2021-01-23 MED ORDER — IBUPROFEN 600 MG PO TABS: 600.0000 mg | ORAL_TABLET | Freq: Three times a day (TID) | ORAL | 0 refills | Status: DC | PRN

## 2021-01-23 MED ORDER — NIFEDIPINE ER 30 MG PO TB24: 30.0000 mg | ORAL_TABLET | Freq: Every day | ORAL | 0 refills | Status: DC

## 2021-01-23 NOTE — Anesthesia Postprocedure Evaluation (Signed)
Anesthesia Post Note  Patient: Laurie Cruz  Procedure(s) Performed: AN AD HOC LABOR EPIDURAL     Patient location during evaluation: Mother Baby Anesthesia Type: Epidural Level of consciousness: awake and alert Pain management: pain level controlled Vital Signs Assessment: post-procedure vital signs reviewed and stable Respiratory status: spontaneous breathing, nonlabored ventilation and respiratory function stable Cardiovascular status: stable Postop Assessment: no headache, no backache and epidural receding Anesthetic complications: no   No notable events documented.  Last Vitals:  Vitals:   01/22/21 2208 01/23/21 0616  BP: 124/80 120/85  Pulse: 74 72  Resp: 18 18  Temp: 36.8 C 36.7 C  SpO2: 100% 100%    Last Pain:  Vitals:   01/23/21 0716  TempSrc:   PainSc: 0-No pain   Pain Goal: Patients Stated Pain Goal: 0 (01/21/21 1212)                 Rica Records

## 2021-01-26 ENCOUNTER — Encounter: Payer: Medicaid Other | Admitting: Certified Nurse Midwife

## 2021-01-28 ENCOUNTER — Other Ambulatory Visit: Payer: Medicaid Other

## 2021-01-28 ENCOUNTER — Ambulatory Visit: Payer: Medicaid Other

## 2021-02-01 ENCOUNTER — Telehealth (HOSPITAL_COMMUNITY): Payer: Self-pay | Admitting: *Deleted

## 2021-02-01 NOTE — Telephone Encounter (Signed)
Patient voiced no questions or concerns regarding her health. Stated, "My blood pressure is good." EPDS = 3. Patient voiced no questions or concerns regarding infant. Patient requested RN email information regarding Baby and Me class. Email sent. Deforest Hoyles, RN, 02/01/21, [phone].

## 2021-02-28 ENCOUNTER — Ambulatory Visit (INDEPENDENT_AMBULATORY_CARE_PROVIDER_SITE_OTHER): Payer: Medicaid Other | Admitting: Obstetrics and Gynecology

## 2021-02-28 ENCOUNTER — Other Ambulatory Visit: Payer: Self-pay

## 2021-02-28 ENCOUNTER — Telehealth: Payer: Self-pay | Admitting: *Deleted

## 2021-02-28 ENCOUNTER — Encounter: Payer: Self-pay | Admitting: Obstetrics and Gynecology

## 2021-02-28 NOTE — Patient Instructions (Signed)
Health Maintenance, Female Adopting a healthy lifestyle and getting preventive care are important in promoting health and wellness. Ask your health care provider about: The right schedule for you to have regular tests and exams. Things you can do on your own to prevent diseases and keep yourself healthy. What should I know about diet, weight, and exercise? Eat a healthy diet  Eat a diet that includes plenty of vegetables, fruits, low-fat dairy products, and lean protein. Do not eat a lot of foods that are high in solid fats, added sugars, or sodium.  Maintain a healthy weight Body mass index (BMI) is used to identify weight problems. It estimates body fat based on height and weight. Your health care provider can help determineyour BMI and help you achieve or maintain a healthy weight. Get regular exercise Get regular exercise. This is one of the most important things you can do for your health. Most adults should: Exercise for at least 150 minutes each week. The exercise should increase your heart rate and make you sweat (moderate-intensity exercise). Do strengthening exercises at least twice a week. This is in addition to the moderate-intensity exercise. Spend less time sitting. Even light physical activity can be beneficial. Watch cholesterol and blood lipids Have your blood tested for lipids and cholesterol at 31 years of age, then havethis test every 5 years. Have your cholesterol levels checked more often if: Your lipid or cholesterol levels are high. You are older than 31 years of age. You are at high risk for heart disease. What should I know about cancer screening? Depending on your health history and family history, you may need to have cancer screening at various ages. This may include screening for: Breast cancer. Cervical cancer. Colorectal cancer. Skin cancer. Lung cancer. What should I know about heart disease, diabetes, and high blood pressure? Blood pressure and heart  disease High blood pressure causes heart disease and increases the risk of stroke. This is more likely to develop in people who have high blood pressure readings, are of African descent, or are overweight. Have your blood pressure checked: Every 3-5 years if you are 18-39 years of age. Every year if you are 40 years old or older. Diabetes Have regular diabetes screenings. This checks your fasting blood sugar level. Have the screening done: Once every three years after age 40 if you are at a normal weight and have a low risk for diabetes. More often and at a younger age if you are overweight or have a high risk for diabetes. What should I know about preventing infection? Hepatitis B If you have a higher risk for hepatitis B, you should be screened for this virus. Talk with your health care provider to find out if you are at risk forhepatitis B infection. Hepatitis C Testing is recommended for: Everyone born from 1945 through 1965. Anyone with known risk factors for hepatitis C. Sexually transmitted infections (STIs) Get screened for STIs, including gonorrhea and chlamydia, if: You are sexually active and are younger than 31 years of age. You are older than 31 years of age and your health care provider tells you that you are at risk for this type of infection. Your sexual activity has changed since you were last screened, and you are at increased risk for chlamydia or gonorrhea. Ask your health care provider if you are at risk. Ask your health care provider about whether you are at high risk for HIV. Your health care provider may recommend a prescription medicine to help   prevent HIV infection. If you choose to take medicine to prevent HIV, you should first get tested for HIV. You should then be tested every 3 months for as long as you are taking the medicine. Pregnancy If you are about to stop having your period (premenopausal) and you may become pregnant, seek counseling before you get  pregnant. Take 400 to 800 micrograms (mcg) of folic acid every day if you become pregnant. Ask for birth control (contraception) if you want to prevent pregnancy. Osteoporosis and menopause Osteoporosis is a disease in which the bones lose minerals and strength with aging. This can result in bone fractures. If you are 65 years old or older, or if you are at risk for osteoporosis and fractures, ask your health care provider if you should: Be screened for bone loss. Take a calcium or vitamin D supplement to lower your risk of fractures. Be given hormone replacement therapy (HRT) to treat symptoms of menopause. Follow these instructions at home: Lifestyle Do not use any products that contain nicotine or tobacco, such as cigarettes, e-cigarettes, and chewing tobacco. If you need help quitting, ask your health care provider. Do not use street drugs. Do not share needles. Ask your health care provider for help if you need support or information about quitting drugs. Alcohol use Do not drink alcohol if: Your health care provider tells you not to drink. You are pregnant, may be pregnant, or are planning to become pregnant. If you drink alcohol: Limit how much you use to 0-1 drink a day. Limit intake if you are breastfeeding. Be aware of how much alcohol is in your drink. In the [place] one drink equals one 12 oz bottle of beer (355 mL), one 5 oz glass of wine (148 mL), or one 1 oz glass of hard liquor (44 mL). General instructions Schedule regular health, dental, and eye exams. Stay current with your vaccines. Tell your health care provider if: You often feel depressed. You have ever been abused or do not feel safe at home. Summary Adopting a healthy lifestyle and getting preventive care are important in promoting health and wellness. Follow your health care provider's instructions about healthy diet, exercising, and getting tested or screened for diseases. Follow your health care provider's  instructions on monitoring your cholesterol and blood pressure. This information is not intended to replace advice given to you by your health care provider. Make sure you discuss any questions you have with your healthcare provider. Document Revised: 07/03/2018 Document Reviewed: 07/03/2018 Elsevier Patient Education  2022 Elsevier Inc.  

## 2021-02-28 NOTE — Telephone Encounter (Signed)
Patient left message requesting call about FMLA forms. Return call to patient- forms have been completed but she now requests note to allow for additional 6 weeks for newborn care and bonding. Advised will have to call patient back.

## 2021-02-28 NOTE — Progress Notes (Signed)
    Post Partum Visit Note  Laurie Cruz is a 30 y.o. [phone] female who presents for a postpartum visit. She is 5 weeks postpartum following a normal spontaneous vaginal delivery.  I have fully reviewed the prenatal and intrapartum course. The delivery was at [redacted]w[redacted]d gestational weeks.  Anesthesia: epidural. Postpartum course has been unremarkable. Baby is doing well. Baby is feeding by bottle Rush Barer . Bleeding thin lochia. Bowel function is normal. Bladder function is normal. Patient is not sexually active. Contraception method is IUD. Postpartum depression screening: negative.   The pregnancy intention screening data noted above was reviewed. Potential methods of contraception were discussed. The patient elected to proceed with No data recorded.    Health Maintenance Due  Topic Date Due   COVID-19 Vaccine (3 - Booster for Pfizer series) 10/26/2020   INFLUENZA VACCINE  02/21/2021    Medical record  Review of Systems Pertinent items noted in HPI and remainder of comprehensive ROS otherwise negative.  Objective:  LMP 04/16/2020    General:  alert   Breasts:  not indicated  Lungs: clear to auscultation bilaterally  Heart:  regular rate and rhythm, S1, S2 normal, no murmur, click, rub or gallop  Abdomen: soft, non-tender; bowel sounds normal; no masses,  no organomegaly   Wound NA  GU exam:  IUD string noted, @ 2-3 length       Assessment:    There are no diagnoses linked to this encounter.  NL postpartum exam.  IUD check  Plan:   Essential components of care per ACOG recommendations:  1.  Mood and well being: Patient with negative depression screening today. Reviewed local resources for support.  - Patient tobacco use? No.   - hx of drug use? No.    2. Infant care and feeding:  -Patient currently breastmilk feeding? No.  -Social determinants of health (SDOH) reviewed in EPIC. No concerns  3. Sexuality, contraception and birth spacing - Patient does not want a  pregnancy in the next year.  Desired family size is uncertain  - Reviewed forms of contraception in tiered fashion. Patient desired IUD today.   - Discussed birth spacing of 18 months  4. Sleep and fatigue -Encouraged family/partner/community support of 4 hrs of uninterrupted sleep to help with mood and fatigue  5. Physical Recovery  - Discussed patients delivery and complications. She describes her labor as good. - Patient had a Vaginal, no problems at delivery. Patient had a  no  laceration. Perineal healing reviewed. Patient expressed understanding - Patient has urinary incontinence? No. - Patient is safe to resume physical and sexual activity  6.  Health Maintenance - HM due items addressed Yes - Last pap smear  Diagnosis  Date Value Ref Range Status  08/05/2020   Final   - Negative for intraepithelial lesion or malignancy (NILM)   Pap smear not done at today's visit.  -Breast Cancer screening indicated? No.   7. Chronic Disease/Pregnancy Condition follow up: None  -Pt educated on bleeding irregularity with IUD. Pt was reassured. Pt has not been taking BP medication regularly. Will d/c Procardia and have pt return in 2 weeks for BP check  Nettie Elm, MD [facility] [employer], [facility]

## 2021-03-02 ENCOUNTER — Encounter: Payer: Self-pay | Admitting: *Deleted

## 2021-03-02 NOTE — Telephone Encounter (Signed)
Call to patient.  Advised have sent My Chart letter regarding additional leave as eligible.  Encounter closed.

## 2021-03-14 ENCOUNTER — Ambulatory Visit: Payer: Medicaid Other

## 2021-03-17 ENCOUNTER — Other Ambulatory Visit: Payer: Self-pay

## 2021-03-17 ENCOUNTER — Encounter: Payer: Self-pay | Admitting: *Deleted

## 2021-03-17 ENCOUNTER — Ambulatory Visit (INDEPENDENT_AMBULATORY_CARE_PROVIDER_SITE_OTHER): Payer: Medicaid Other

## 2021-03-17 VITALS — BP 135/70 | HR 87 | Ht 66.0 in | Wt 247.1 lb

## 2021-03-17 DIAGNOSIS — O163 Unspecified maternal hypertension, third trimester: Secondary | ICD-10-CM

## 2021-03-17 NOTE — Progress Notes (Signed)
Patient was assessed and managed by nursing staff during this encounter. I have reviewed the chart and agree with the documentation and plan. I have also made any necessary editorial changes.  Catalina Antigua, MD 03/17/2021 11:31 AM

## 2021-03-17 NOTE — Progress Notes (Signed)
Pt here today for follow up BP check following 2 weeks with no BP meds per Dr Alysia Penna. Pt denies any headaches, swelling or visual changes. Pt had NSVD on 01/22/21.  BP: 135/70    Pt advised to continue to monitor. If has symptoms, then to follow up with PCP. PCP referral made per pt's request.   Judeth Cornfield, RN

## 2021-03-18 ENCOUNTER — Other Ambulatory Visit: Payer: Self-pay | Admitting: Advanced Practice Midwife

## 2021-10-09 IMAGING — US US MFM OB COMP +14 WKS
1 series · 13 of 28 positions shown · non-contrast
Comparison: none

[Series 1: us mfm ob comp +14 wks · 13 of 103 slices shown]
[im 4/103]
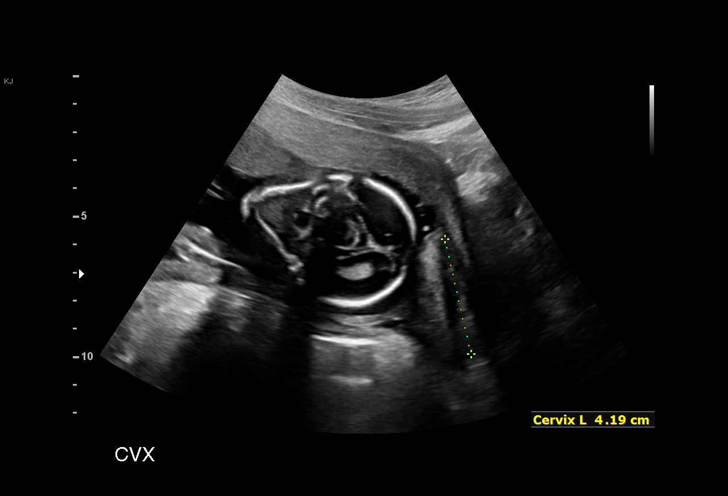
[im 12/103]
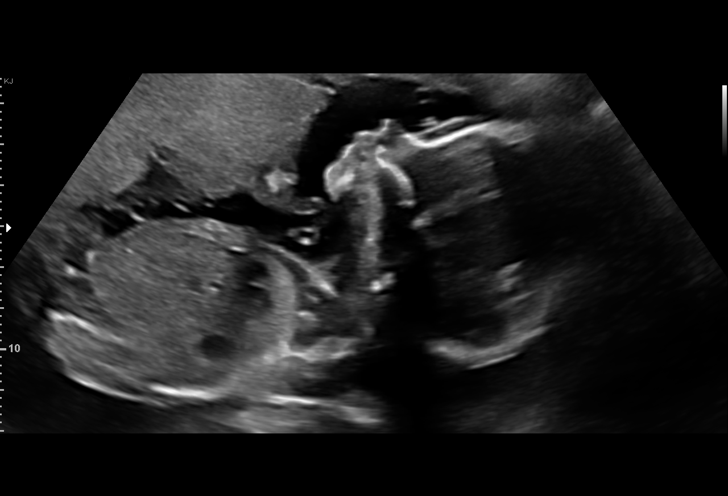
[im 19/103]
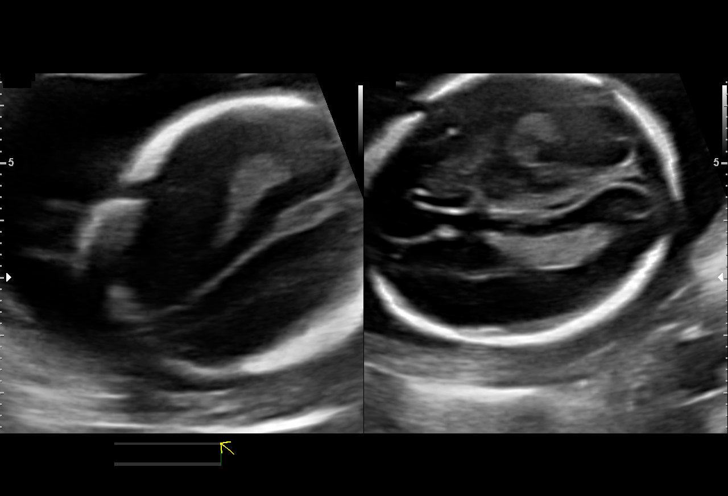
[im 27/103]
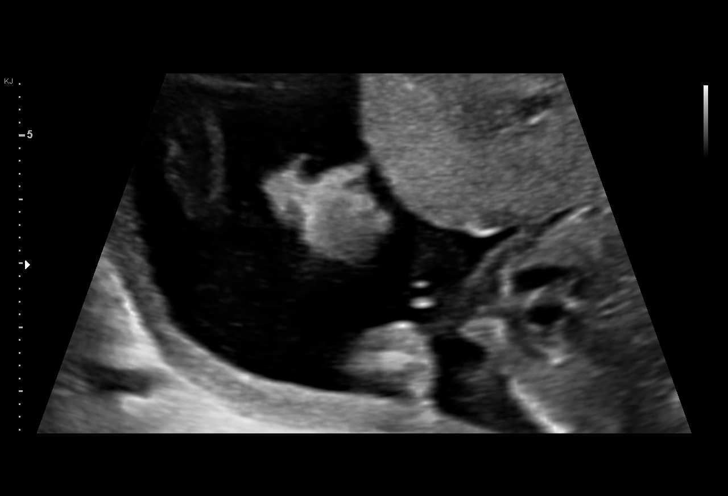
[im 35/103]
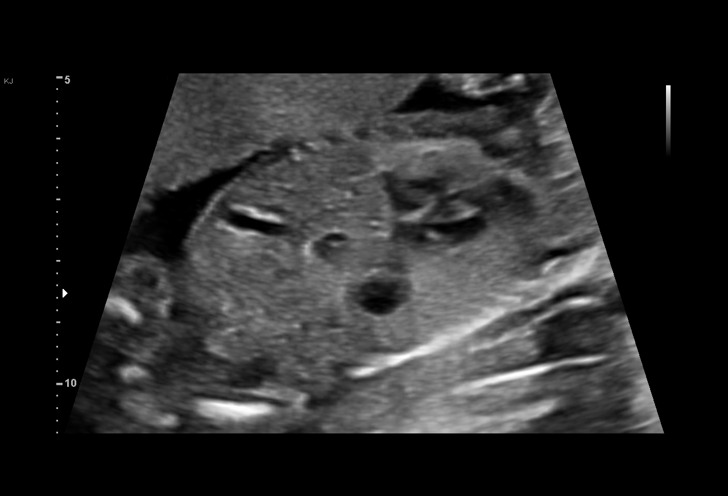
[im 42/103]
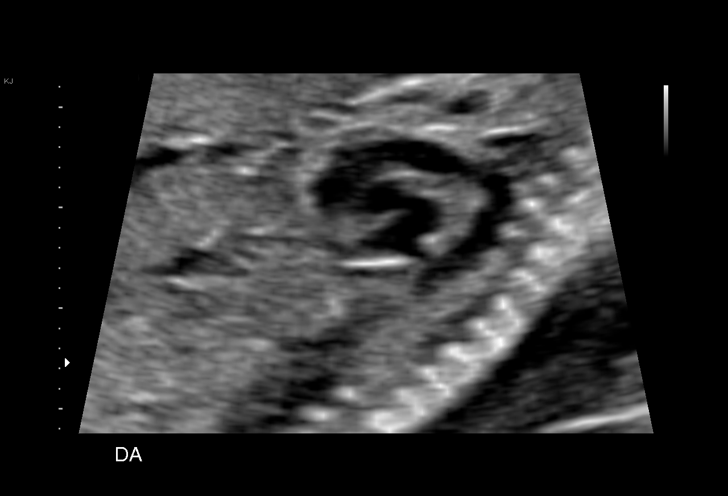
[im 53/103]
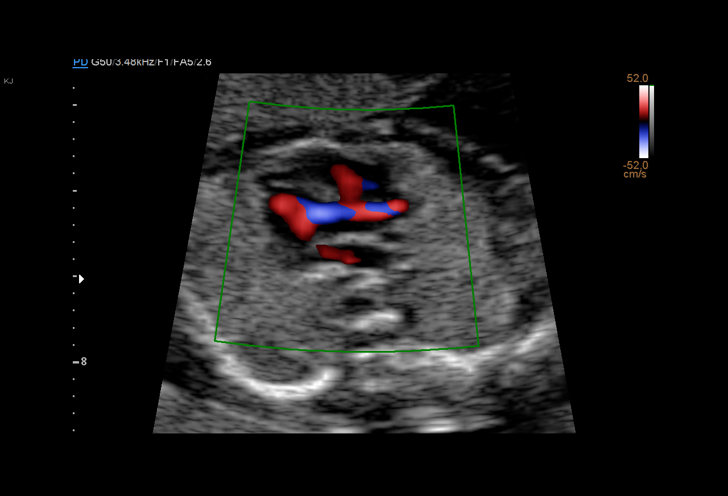
[im 61/103]
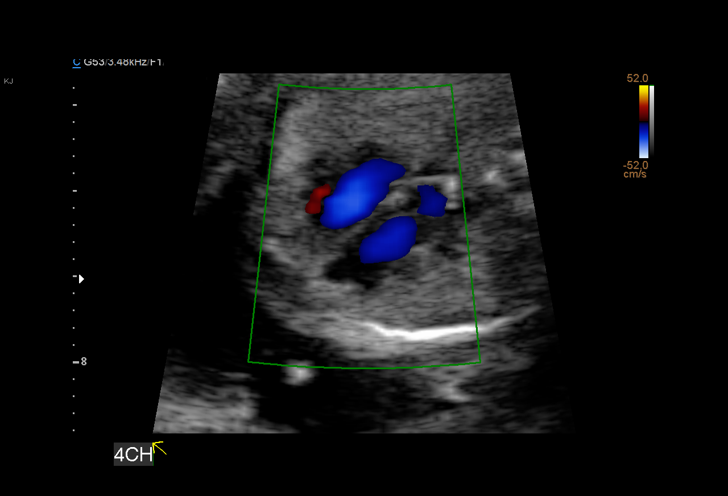
[im 69/103]
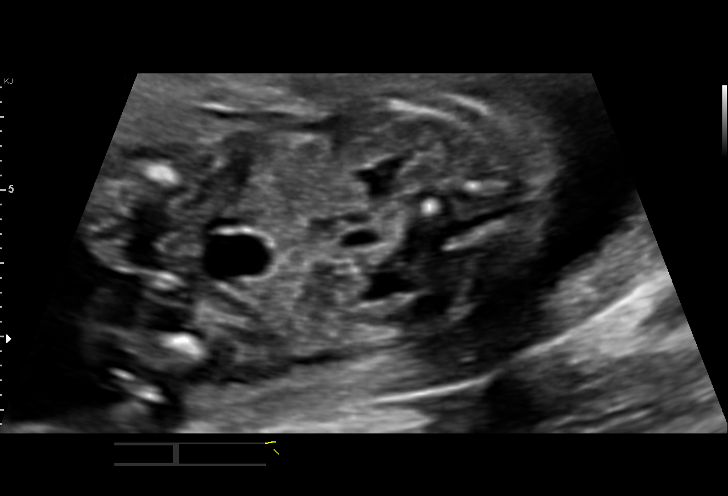
[im 76/103]
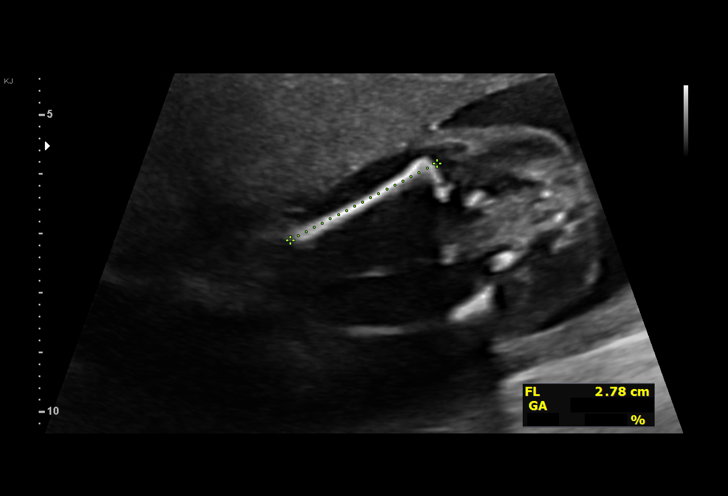
[im 84/103]
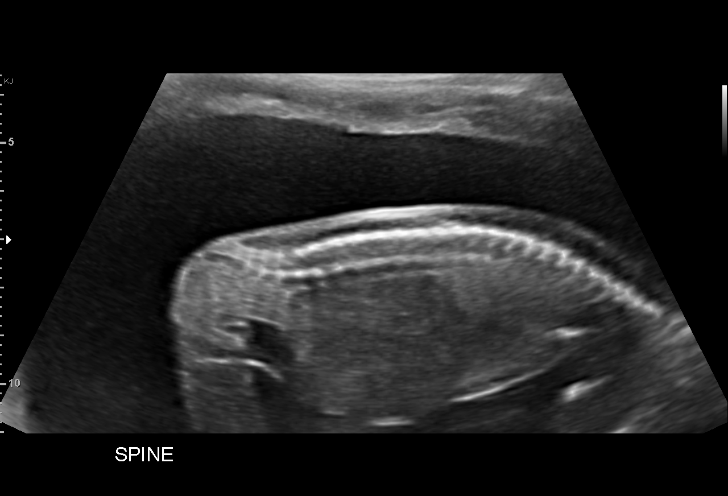
[im 91/103]
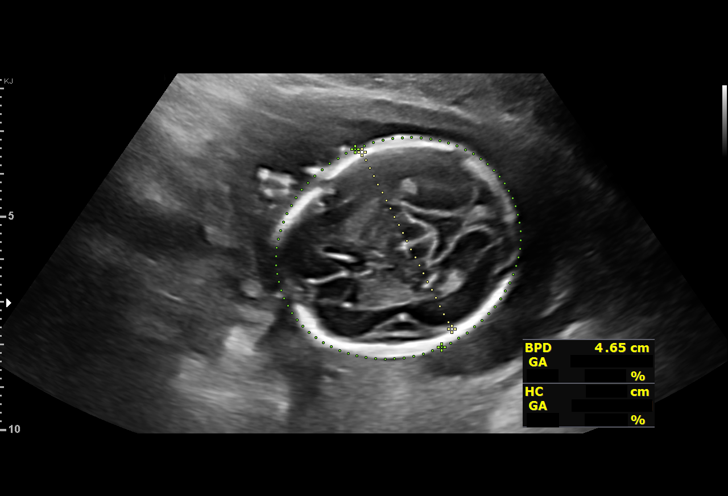
[im 99/103]
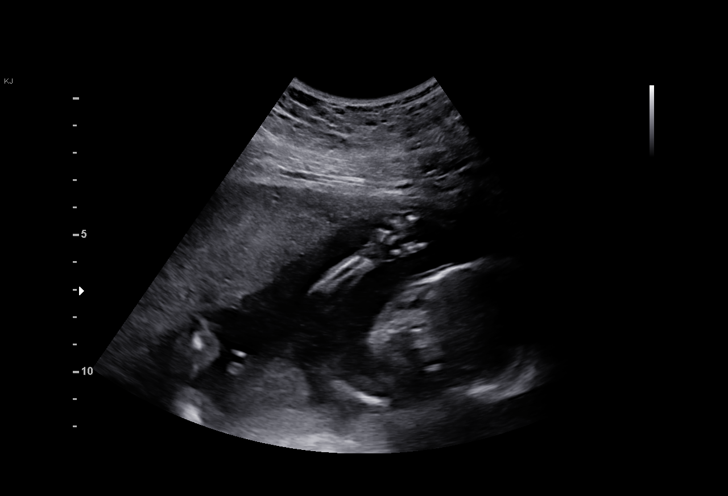

[13 of 28 positions shown; findings below may reference images not displayed]

[REDACTED]care
                   WALKER CNM

 1  US MFM OB COMP + 14 WK                76805.01    [facility] DALLOS

Indications

 19 weeks gestation of pregnancy
 Obesity complicating pregnancy, second
 trimester
Fetal Evaluation

 Num Of Fetuses:         1
 Fetal Heart Rate(bpm):  132
 Cardiac Activity:       Observed
 Presentation:           Cephalic
 Placenta:               Anterior
 P. Cord Insertion:      Visualized, central

 Amniotic Fluid
 AFI FV:      Within normal limits
Biometry

 BPD:        47  mm     G. Age:  20w 1d         91  %    CI:        76.22   %    70 - 86
                                                         FL/HC:      16.6   %    16.1 -
 HC:      170.6  mm     G. Age:  19w 5d         74  %    HC/AC:      1.25        1.09 -
 AC:      136.5  mm     G. Age:  19w 1d         49  %    FL/BPD:     60.2   %
 FL:       28.3  mm     G. Age:  18w 5d         31  %    FL/AC:      20.7   %    20 - 24
 HUM:      27.6  mm     G. Age:  18w 6d         46  %
 CER:        20  mm     G. Age:  19w 2d         50  %
 NFT:       3.7  mm
 LV:        4.5  mm
 CM:        4.9  mm
 Est. FW:     269  gm      0 lb 9 oz     46  %
Gestational Age

 LMP:           19w 0d        Date:  04/16/20                 EDD:   01/21/21
 U/S Today:     19w 3d                                        EDD:   01/18/21
 Best:          19w 0d     Det. By:  LMP  (04/16/20)          EDD:   01/21/21
Anatomy

 Cranium:               Appears normal         LVOT:                   Appears normal
 Cavum:                 Appears normal         Aortic Arch:            Appears normal
 Ventricles:            Appears normal         Ductal Arch:            Appears normal
 Choroid Plexus:        Appears normal         Diaphragm:              Appears normal
 Cerebellum:            Appears normal         Stomach:                Appears normal, left
                                                                       sided
 Posterior Fossa:       Appears normal         Abdomen:                Appears normal
 Nuchal Fold:           Appears normal         Abdominal Wall:         Appears nml (cord
                                                                       insert, abd wall)
 Face:                  Appears normal         Cord Vessels:           Appears normal (3
                        (orbits and profile)                           vessel cord)
 Lips:                  Appears normal         Kidneys:                Appear normal
 Palate:                Appears normal         Bladder:                Appears normal
 Thoracic:              Appears normal         Spine:                  Appears normal
 Heart:                 Appears normal         Upper Extremities:      Appears normal
                        (4CH, axis, and
                        situs)
 RVOT:                  Appears normal         Lower Extremities:      Appears normal

 Other:  SVC IVC appears normal. 3VV/T appears normal/ Nasal bone
         visualized. Heels and 5th digit visualized. Fetus appears to be female.
Cervix Uterus Adnexa

 Cervix
 Length:           4.04  cm.
 Normal appearance by transabdominal scan.

 Right Ovary
 Appears normal

 Left Ovary
 Appears normal

 Adnexa
 No abnormality visualized.
Impression

 G4 P1.  Patient is here for fetal anatomy scan.
 On cell-free fetal DNA screening, the risks of fetal
 aneuploidies are not increased .
 Obstetric history significant for a term vaginal delivery.

 We performed fetal anatomy scan. No makers of
 aneuploidies or fetal structural defects are seen. Fetal
 biometry is consistent with her previously-established dates.
 Amniotic fluid is normal and good fetal activity is seen.
 Patient understands the limitations of ultrasound in detecting
 fetal anomalies.
Recommendations

 Recommend fetal growth assessment at 32 weeks gestation.
                 [place], [place]
